# Patient Record
Sex: Female | Born: 2000 | Race: Black or African American | Hispanic: No | Marital: Single | Smoking: Never smoker
Health system: Southern US, Community
[De-identification: ages and names within clinical notes are randomized; demographics above are authoritative.]

## PROBLEM LIST (undated history)

## (undated) ENCOUNTER — Inpatient Hospital Stay (HOSPITAL_COMMUNITY): Payer: Self-pay

## (undated) ENCOUNTER — Ambulatory Visit (HOSPITAL_COMMUNITY): Payer: Medicaid Other

## (undated) DIAGNOSIS — J309 Allergic rhinitis, unspecified: Secondary | ICD-10-CM

## (undated) DIAGNOSIS — O0990 Supervision of high risk pregnancy, unspecified, unspecified trimester: Secondary | ICD-10-CM

## (undated) DIAGNOSIS — J45909 Unspecified asthma, uncomplicated: Secondary | ICD-10-CM

## (undated) DIAGNOSIS — Z3400 Encounter for supervision of normal first pregnancy, unspecified trimester: Secondary | ICD-10-CM

## (undated) DIAGNOSIS — A749 Chlamydial infection, unspecified: Secondary | ICD-10-CM

## (undated) DIAGNOSIS — O1413 Severe pre-eclampsia, third trimester: Secondary | ICD-10-CM

## (undated) DIAGNOSIS — E669 Obesity, unspecified: Secondary | ICD-10-CM

## (undated) DIAGNOSIS — O365990 Maternal care for other known or suspected poor fetal growth, unspecified trimester, not applicable or unspecified: Secondary | ICD-10-CM

## (undated) DIAGNOSIS — L209 Atopic dermatitis, unspecified: Secondary | ICD-10-CM

## (undated) DIAGNOSIS — O43129 Velamentous insertion of umbilical cord, unspecified trimester: Secondary | ICD-10-CM

## (undated) HISTORY — DX: Atopic dermatitis, unspecified: L20.9

## (undated) HISTORY — DX: Obesity, unspecified: E66.9

## (undated) HISTORY — PX: TONSILLECTOMY: SUR1361

## (undated) HISTORY — DX: Allergic rhinitis, unspecified: J30.9

---
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
## 2019-04-24 ENCOUNTER — Other Ambulatory Visit: Payer: Self-pay | Admitting: Pediatrics

## 2019-04-24 DIAGNOSIS — J4541 Moderate persistent asthma with (acute) exacerbation: Secondary | ICD-10-CM

## 2019-04-25 ENCOUNTER — Other Ambulatory Visit: Payer: Self-pay

## 2019-04-25 ENCOUNTER — Encounter: Payer: Self-pay | Admitting: Pediatrics

## 2019-04-25 ENCOUNTER — Ambulatory Visit (INDEPENDENT_AMBULATORY_CARE_PROVIDER_SITE_OTHER): Payer: Medicaid Other | Admitting: Pediatrics

## 2019-04-25 DIAGNOSIS — J4541 Moderate persistent asthma with (acute) exacerbation: Secondary | ICD-10-CM

## 2019-04-25 DIAGNOSIS — J4550 Severe persistent asthma, uncomplicated: Secondary | ICD-10-CM

## 2019-04-25 DIAGNOSIS — J309 Allergic rhinitis, unspecified: Secondary | ICD-10-CM

## 2019-04-25 MED ORDER — MONTELUKAST SODIUM 10 MG PO TABS: 10.0000 mg | ORAL_TABLET | Freq: Every day | ORAL | 5 refills | Status: DC

## 2019-04-25 MED ORDER — FLOVENT HFA 110 MCG/ACT IN AERO: 2.0000 | INHALATION_SPRAY | Freq: Two times a day (BID) | RESPIRATORY_TRACT | 11 refills | Status: DC

## 2019-04-25 MED ORDER — PROAIR HFA 108 (90 BASE) MCG/ACT IN AERS: INHALATION_SPRAY | RESPIRATORY_TRACT | 0 refills | Status: DC

## 2019-04-25 MED ORDER — CETIRIZINE HCL 10 MG PO TABS: 10.0000 mg | ORAL_TABLET | Freq: Every day | ORAL | 5 refills | Status: DC

## 2019-04-25 NOTE — Telephone Encounter (Signed)
Refill request received for albuterol  Last seen 06/2018, is now 74 years ol Last seen for this problem, asthma,: 06/2018  If patient would like a refill, the family will need a visit before a refill will be approved.   Since she is 46, changing to a provider for adult patients is appropriate  Virtual visit is  appropriate.   Please call family to find out if they requested more medicine or if the request was an automatic request from Pharmacy.  Refill not approved.

## 2019-04-25 NOTE — Progress Notes (Signed)
Virtual Visit via Video Note  I connected with Amanda Daniel on 04/25/19 at  4:20 PM EDT by a video enabled telemedicine application and verified that I am speaking with the correct person using two identifiers.   Location of patient/parent: home   I discussed the limitations of evaluation and management by telemedicine and the availability of in person appointments.  I discussed that the purpose of this telehealth visit is to provide medical care while limiting exposure to the novel coronavirus.  The patient expressed understanding and agreed to proceed.  Reason for visit:   Refill for albuterol requested  History of Present Illness:   Seen on 06/2018 for ashtna with exacerbation At that visit Occasional flonase--once a month and cetirizine once a month No singulair Used proair every day,  Had current exacerbation with wheezing at that visit Enalapril after pregnancy Plan : restart flovent, and singulair,  Plan use cetirizine and flonase regularly  Requested FU in one month   Now  Asthma not acting up now, just ran out albutero It has been a while since she had felt she had a strong exacerbation. But she coughs at night, most nights, twice a night And uses pro-air at least once a day She coughs a lot (like crazy) when she walks upstairs She is not using any controller medicine for her asthma or her allergies. Specifically, she is not using Flovent, Flonase, or Singulair  She has been out of all her medicines for a while When she took all of her medicines as prescribed, it helped a lot.  She did not have a heavy breathing like she usually does  When she took the Flovent, she took it twice a day and it helped a lot. She has a lot of the Flonase on hand, she needs a refill on cetirizine  She is no longer using enalapril and her blood pressure is back to normal Observations/Objective:   Alert active Speaks full sentences without coughing or trouble breathing No  distress  Assessment and Plan:   Severe persistent asthma Patient not aware if she is currently wheezing  Plan refills Flovent to take twice a day Pro-air as needed Also refills of cetirizine and Singulair She plans to use Flonase but does not need refills  Plan follow-up video phone call September 8 at 330 to check on frequency of albuterol use, cough at night, cough with climbing stairs.  Follow Up Instructions:   I discussed the assessment and treatment plan with the patient and/or parent/guardian. They were provided an opportunity to ask questions and all were answered. They agreed with the plan and demonstrated an understanding of the instructions.   They were advised to call back or seek an in-person evaluation in the emergency room if the symptoms worsen or if the condition fails to improve as anticipated.  I spent 25 minutes on this telehealth visit inclusive of face-to-face video and care coordination time I was located at clinic during this encounter.  Roselind Messier, MD

## 2019-05-05 IMAGING — US US MFM FETAL NUCHAL TRANSLUCENCY
1 series · 15 of 28 positions shown · non-contrast
Comparison: none

[Series 1: us mfm fetal nuchal translucency · 15 of 35 slices shown]
[im 1/35]
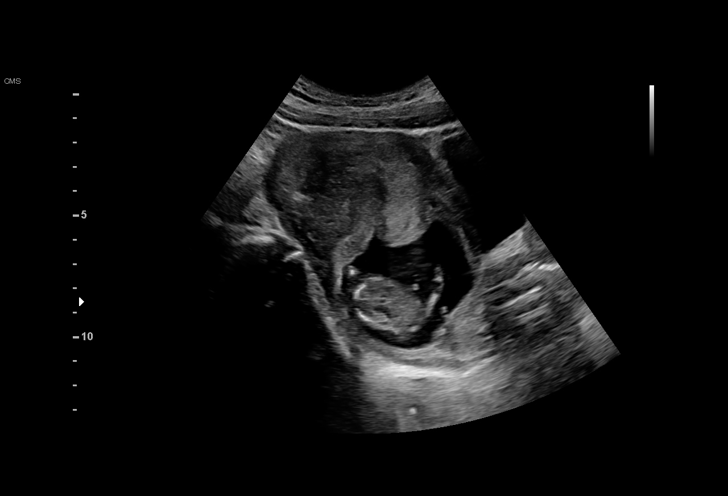
[im 3/35]
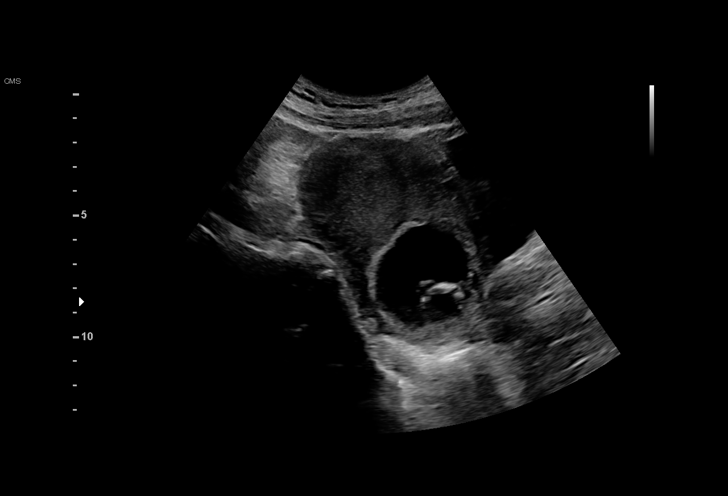
[im 6/35]
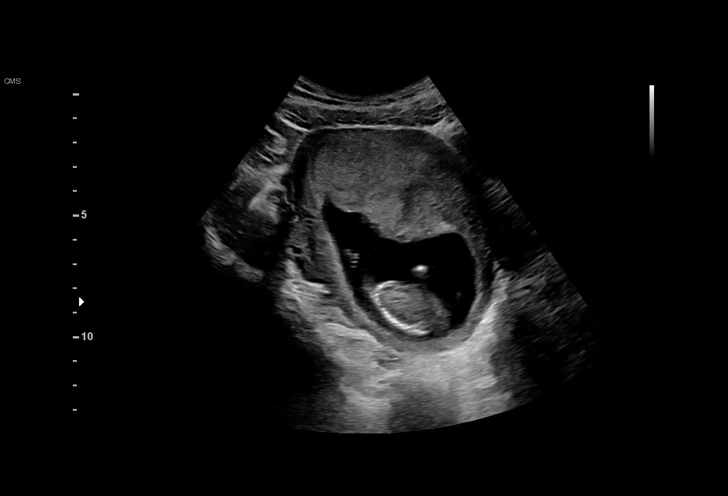
[im 8/35]
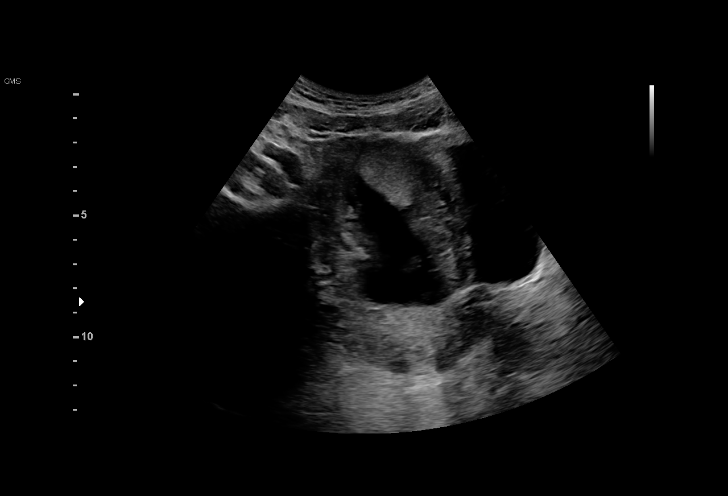
[im 11/35]
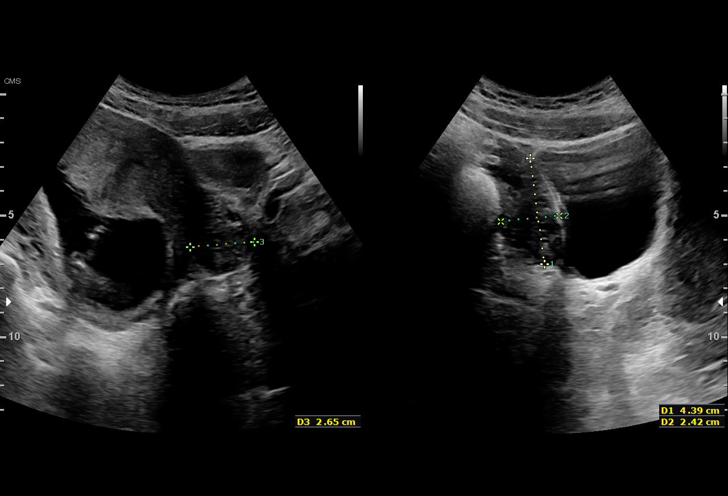
[im 13/35]
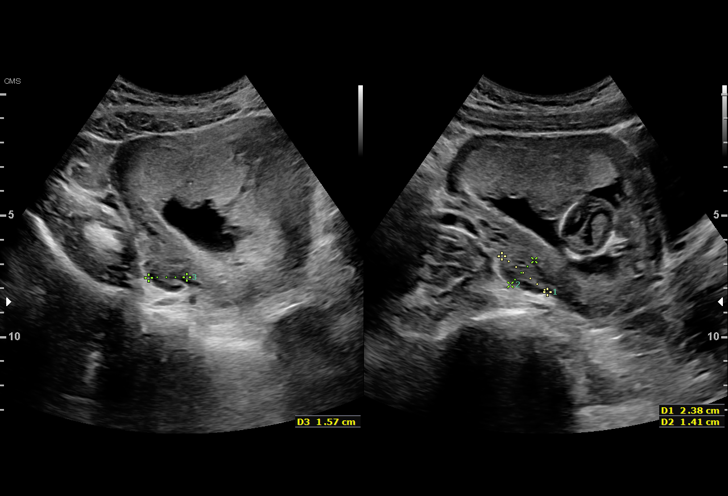
[im 16/35]
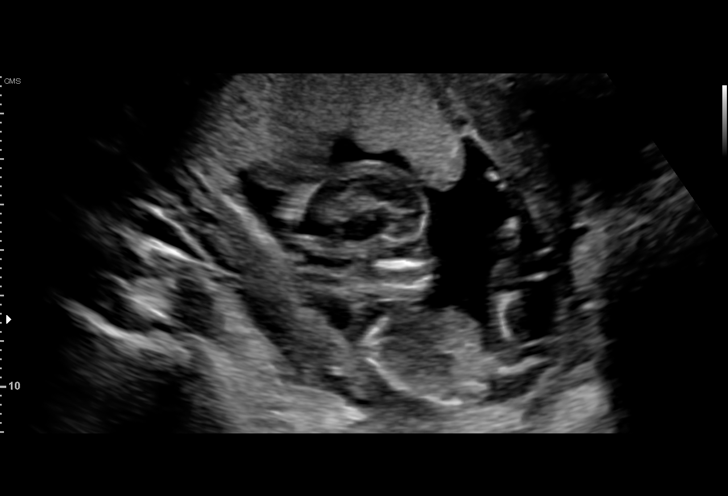
[im 18/35]
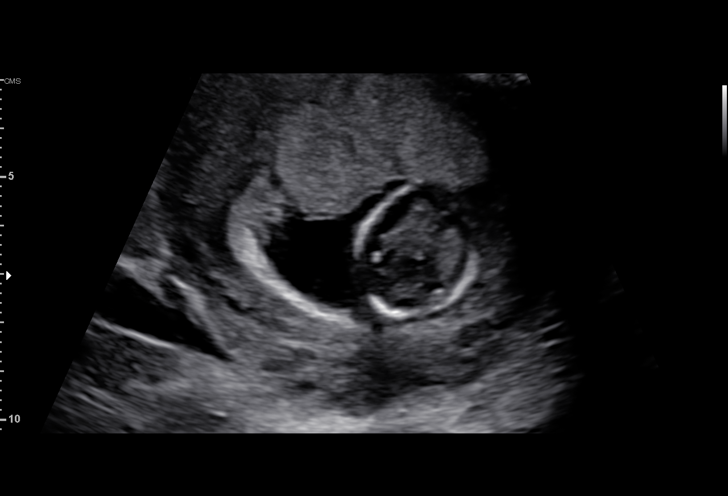
[im 19/35]
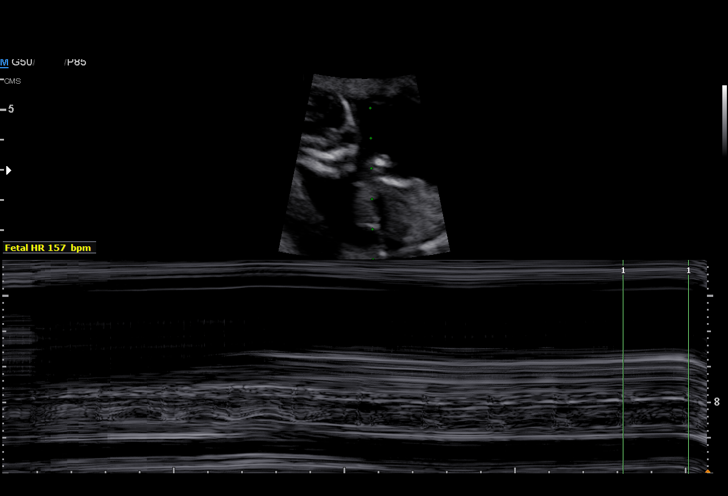
[im 22/35]
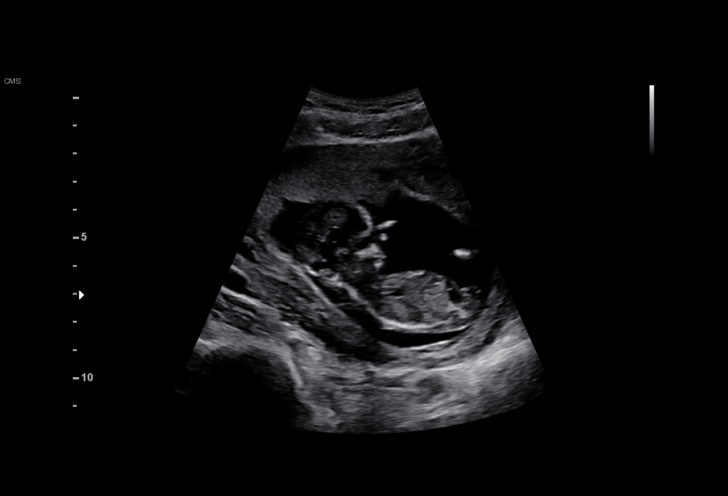
[im 24/35]
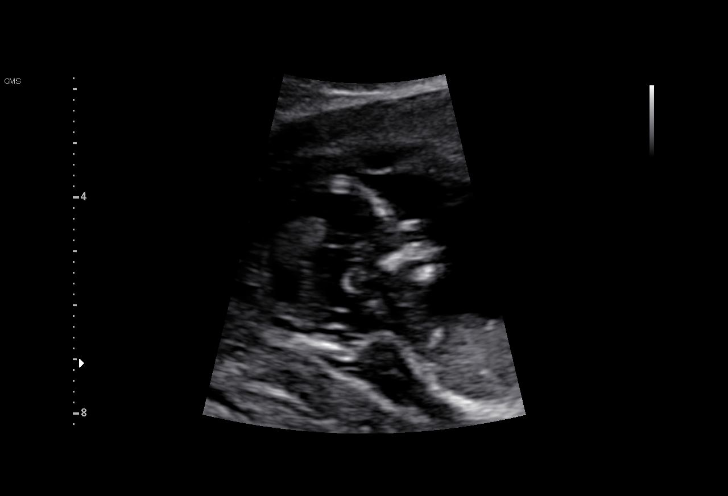
[im 27/35]
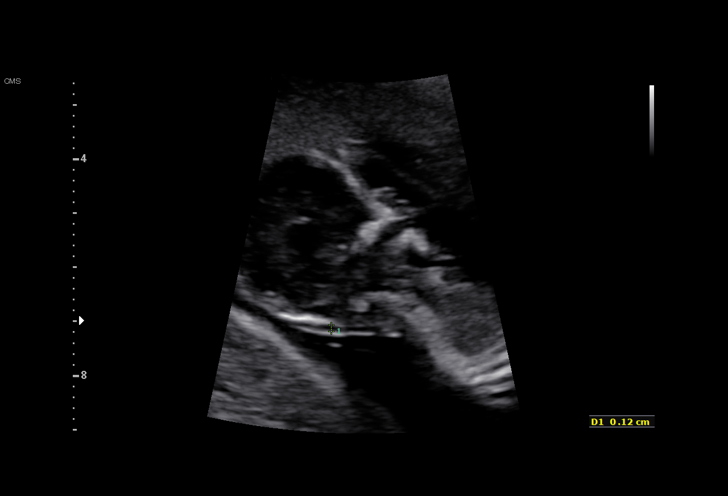
[im 29/35]
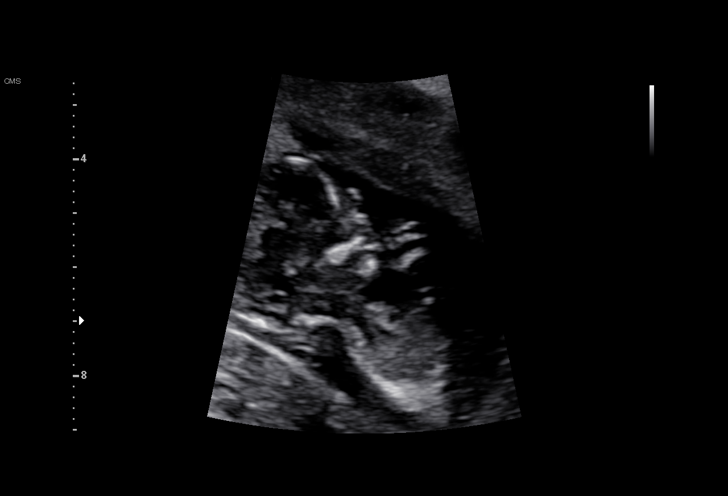
[im 32/35]
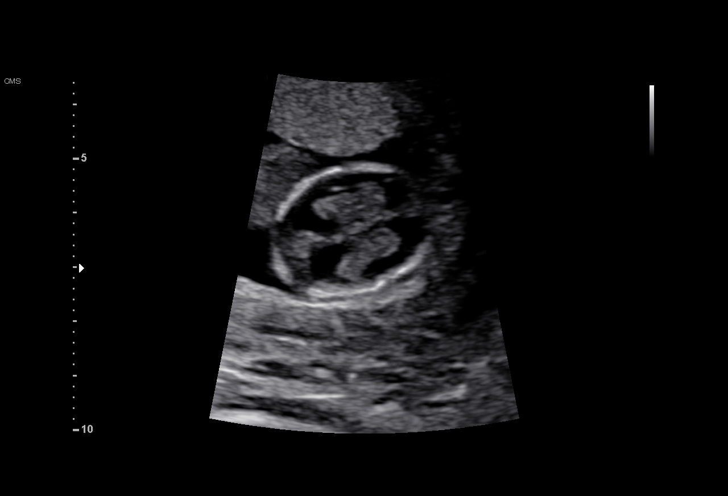
[im 35/35]
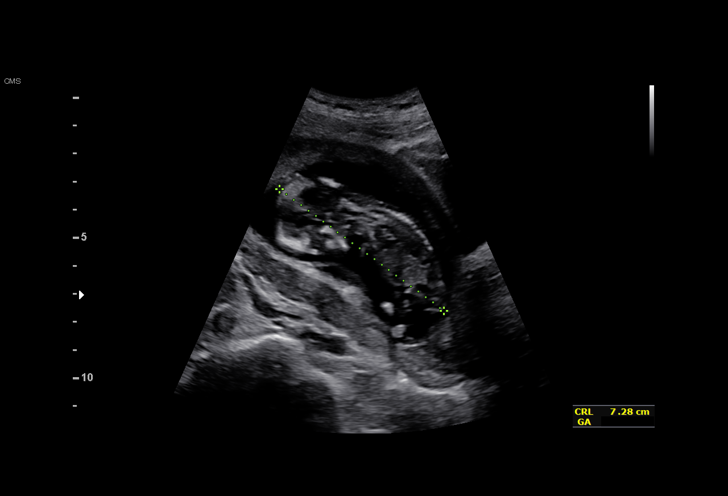

[15 of 28 positions shown; findings below may reference images not displayed]

OB/Gyn Clinic

TRANSLUCENCY

1  OGNJEN OGI BESKER         [id number]      [id number]     [id number]
Indications

13 weeks gestation of pregnancy
Encounter for nuchal translucency
OB History

Gravidity:    1         Term:   0        Prem:   0        SAB:   0
TOP:          0       Ectopic:  0        Living: 0
Fetal Evaluation

Num Of Fetuses:     1
Preg. Location:     Intrauterine
Gest. Sac:          Intrauterine
Fetal Pole:         Visualized
Fetal Heart         157
Rate(bpm):
Cardiac Activity:   Observed
Biometry

CRL:      72.8  mm     G. Age:  13w 1d                  EDD:   12/06/17
Gestational Age

Best:          13w 6d    Det. By:   Early Ultrasound         EDD:   12/01/17
(04/05/17)
1st Trimester Genetic Sonogram Screening

CRL:            72.8  mm    G. Age:   13w 1d                 EDD:   12/06/17
Nuc Trans:       1.2  mm
Nasal Bone:                 Present
Cervix Uterus Adnexa

Cervix
Closed

Uterus
No abnormality visualized.

Left Ovary
Within normal limits.

Right Ovary
Within normal limits.

Cul De Sac:   No free fluid seen.

Adnexa:       No abnormality visualized.
Impression

Single living intrauterine pregnancy at 97w7d.
NT 1.2mm.
Nasal bone present.
First trimester screen labs drawn.
Recommendations

Follow-up ultrasound at 18 weeks for complete anatomy
survey

## 2019-05-15 ENCOUNTER — Other Ambulatory Visit: Payer: Self-pay | Admitting: Pediatrics

## 2019-05-15 DIAGNOSIS — J4541 Moderate persistent asthma with (acute) exacerbation: Secondary | ICD-10-CM

## 2019-05-30 ENCOUNTER — Ambulatory Visit (INDEPENDENT_AMBULATORY_CARE_PROVIDER_SITE_OTHER): Payer: Medicaid Other | Admitting: Pediatrics

## 2019-05-30 ENCOUNTER — Encounter: Payer: Self-pay | Admitting: Pediatrics

## 2019-05-30 DIAGNOSIS — J4540 Moderate persistent asthma, uncomplicated: Secondary | ICD-10-CM | POA: Diagnosis not present

## 2019-05-30 NOTE — Progress Notes (Signed)
Virtual Visit via Video Note  I connected with Amanda Daniel 's patient  on 05/30/19 at  3:30 PM EDT by a video enabled telemedicine application and verified that I am speaking with the correct person using two identifiers.   Location of patient/parent: on street   I discussed the limitations of evaluation and management by telemedicine and the availability of in person appointments.  I discussed that the purpose of this telehealth visit is to provide medical care while limiting exposure to the novel coronavirus.  The patient expressed understanding and agreed to proceed.  Reason for visit:   Follow up asthma  History of Present Illness:   8/4: asthma visit Prompted by Albuterol request Coughing most nights, Pro-air twice a day , most days  Now? Now going good New one is helping-Flovent  twice a day--two puff  And a spacer  No cough at night needs albuterol--twice a week  Walking better Has more energy  Allergy meds: Not using unless need it and not really needed  Is using singulair  No refills needed   Observations/Objective:   No cough no apparent distress Completing full sentences easily  Assessment and Plan:   Much improved asthma control With regular use of controller Flovent  No refills needed  Follow Up Instructions:   Follow-up video call in 2 months  No   changes in medicine prescriptions I discussed the assessment and treatment plan with the patient and/or parent/guardian. They were provided an opportunity to ask questions and all were answered. They agreed with the plan and demonstrated an understanding of the instructions.   They were advised to call back or seek an in-person evaluation in the emergency room if the symptoms worsen or if the condition fails to improve as anticipated.  I spent 15 minutes on this telehealth visit inclusive of face-to-face video and care coordination time I was located at clinic during this encounter.  Roselind Messier, MD

## 2019-08-01 ENCOUNTER — Encounter: Payer: Self-pay | Admitting: Pediatrics

## 2019-08-01 ENCOUNTER — Ambulatory Visit (INDEPENDENT_AMBULATORY_CARE_PROVIDER_SITE_OTHER): Payer: Medicaid Other | Admitting: Pediatrics

## 2019-08-01 DIAGNOSIS — J309 Allergic rhinitis, unspecified: Secondary | ICD-10-CM | POA: Diagnosis not present

## 2019-08-01 DIAGNOSIS — J4541 Moderate persistent asthma with (acute) exacerbation: Secondary | ICD-10-CM | POA: Diagnosis not present

## 2019-08-01 MED ORDER — TRIAMCINOLONE ACETONIDE 0.1 % EX OINT: 1.0000 "application " | TOPICAL_OINTMENT | Freq: Two times a day (BID) | CUTANEOUS | 1 refills | Status: DC

## 2019-08-01 MED ORDER — FLUTICASONE PROPIONATE 50 MCG/ACT NA SUSP: 1.0000 | Freq: Every day | NASAL | 5 refills | Status: AC

## 2019-08-01 MED ORDER — FLOVENT HFA 110 MCG/ACT IN AERO: 2.0000 | INHALATION_SPRAY | Freq: Two times a day (BID) | RESPIRATORY_TRACT | 11 refills | Status: AC

## 2019-08-01 MED ORDER — MONTELUKAST SODIUM 10 MG PO TABS: 10.0000 mg | ORAL_TABLET | Freq: Every day | ORAL | 5 refills | Status: DC

## 2019-08-01 MED ORDER — CETIRIZINE HCL 10 MG PO TABS: 10.0000 mg | ORAL_TABLET | Freq: Every day | ORAL | 5 refills | Status: DC

## 2019-08-01 NOTE — Progress Notes (Signed)
Virtual Visit via Video Note  I connected with Rikita Tretter 's patient  on 08/01/19 at  4:10 PM EST by a video enabled telemedicine application and verified that I am speaking with the correct person using two identifiers.   Location of patient/parent: home   I discussed the limitations of evaluation and management by telemedicine and the availability of in person appointments.  I discussed that the purpose of this telehealth visit is to provide medical care while limiting exposure to the novel coronavirus.  The mother expressed understanding and agreed to proceed.  Reason for visit:  Chief Complaint  Patient presents with  . Follow-up    asthma  . Rash    neck x week   . Medication Refill    all meds    History of Present Illness:  August 4 video visit seen for asthma, coughing most nights and using albuterol twice a day most days.  Restart Singulair and Flovent and spacer  Follow-up visit September 8 using Flovent Using albuterol twice a week Not really using allergy medicines  Asthma: " It is great" Not bothering her at all  Using Flonase most days Cetirizine and singulair most days --initially patient said she took a pill every day.  It is not clear to me whether she takes cetirizine or Singulair more regularly or if she takes both every day.  She request refills for both  Flovent 2 puff everyday  Walk ok and running after her 24-year-old No cough when walk   Need refill all meds  Proair once a week--less than last video visit 2 months ago  New problem  bumps on neck for about one  Wondering if it is eczema They are itchy No where else Putting anything on it--tried diaper rash cream,    Observations/Objective:   Full sentences No cough  Neck with discrete 3 mm pink papules bilateral  Assessment and Plan:   Moderate to severe persistent asthma with recent episode of very poor control with now improving control. Plan refill Singulair, cetirizine, Flovent,  and Flonase No refill Proventil needed, but acceptable  New rash and patient with a history of atopic dermatitis More likely contact for unusual distribution of neck only Trial triamcinolone 0.1% twice daily for no more than a week  Follow Up Instructions:   Video visit 2 months Or as needed   I discussed the assessment and treatment plan with the patient and/or parent/guardian. They were provided an opportunity to ask questions and all were answered. They agreed with the plan and demonstrated an understanding of the instructions.   They were advised to call back or seek an in-person evaluation in the emergency room if the symptoms worsen or if the condition fails to improve as anticipated.  I spent 15 minutes on this telehealth visit inclusive of face-to-face video and care coordination time I was located at clinic during this encounter.  Roselind Messier, MD

## 2019-09-07 IMAGING — US US MFM UA CORD DOPPLER
1 series · 15 of 28 positions shown · non-contrast
Comparison: none

[Series 1: us mfm ua cord doppler · 31 acquisitions, 15 frames shown]
[im 1/31]
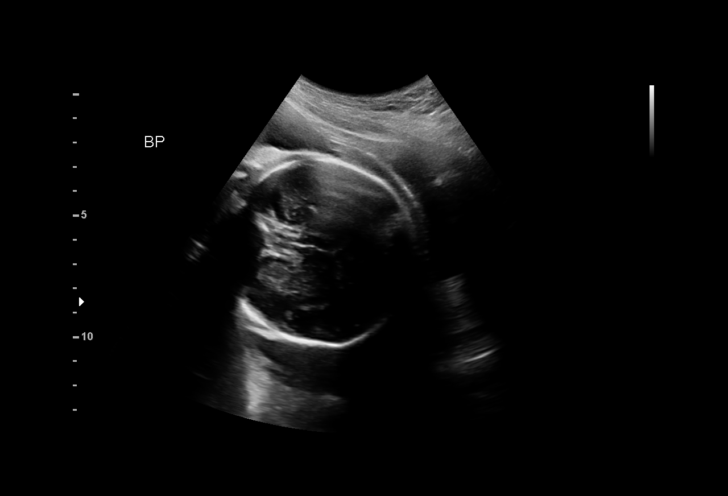
[im 3/31]
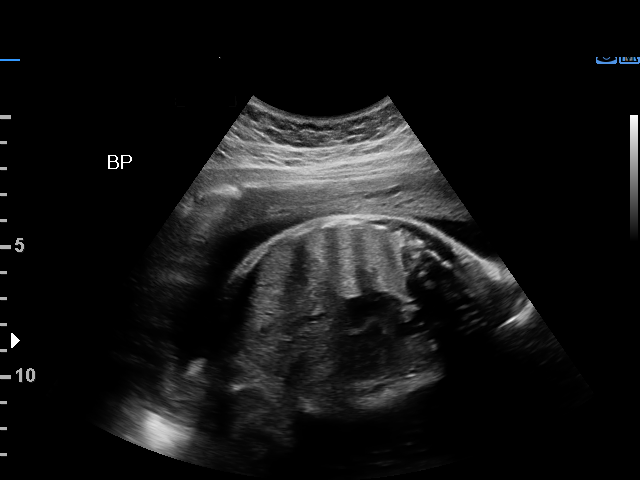
[im 5/31]
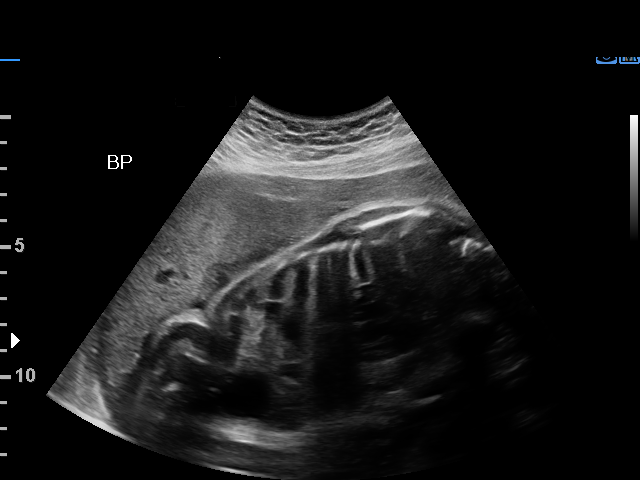
[im 7/31]
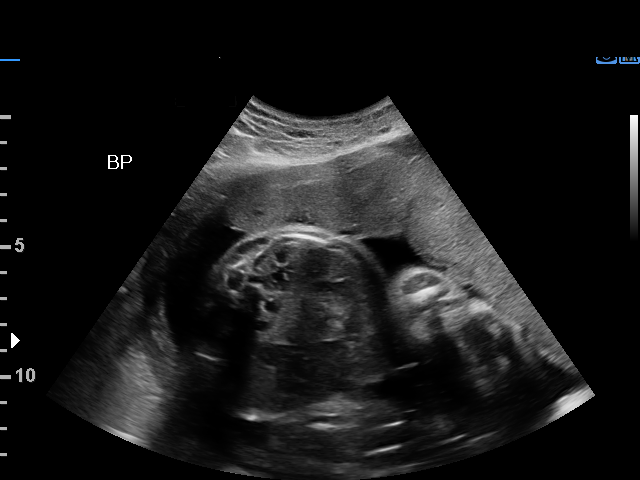
[im 9/31]
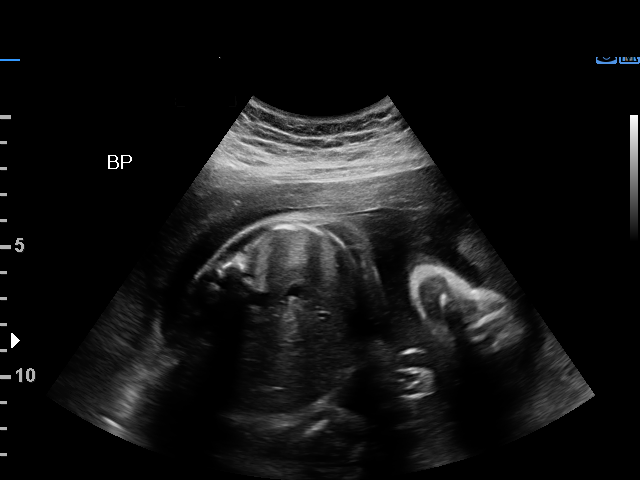
[im 12/31]
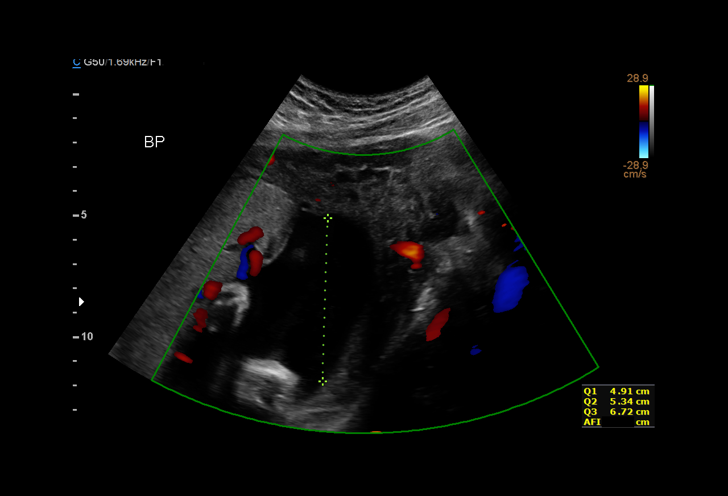
[im 14/31]
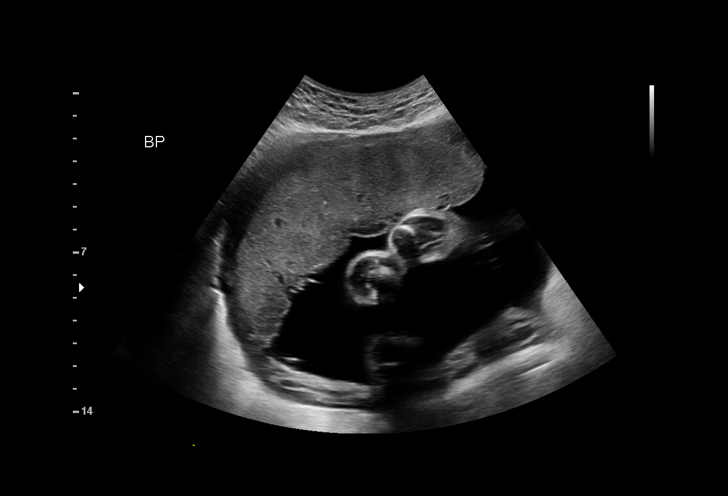
[im 16/31]
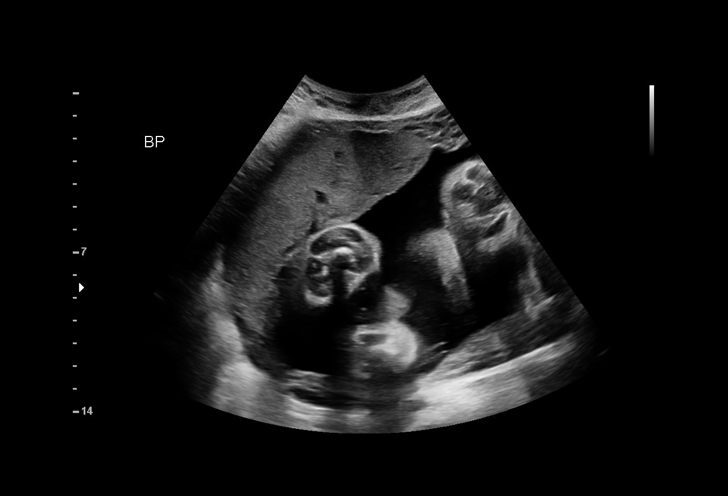
[im 17/31]
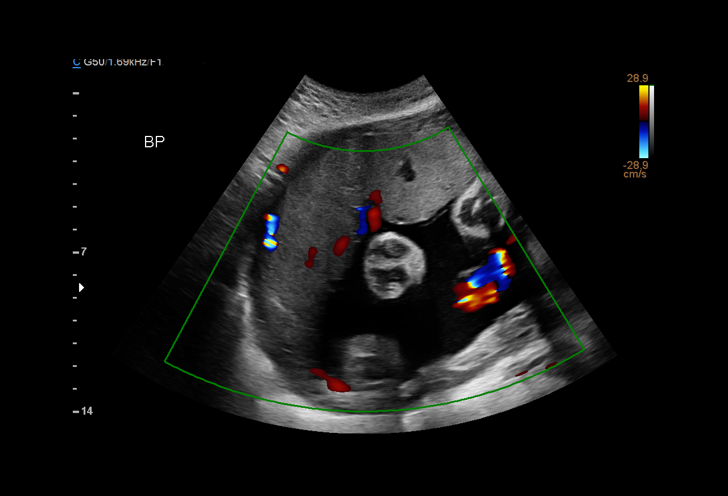
[im 19/31]
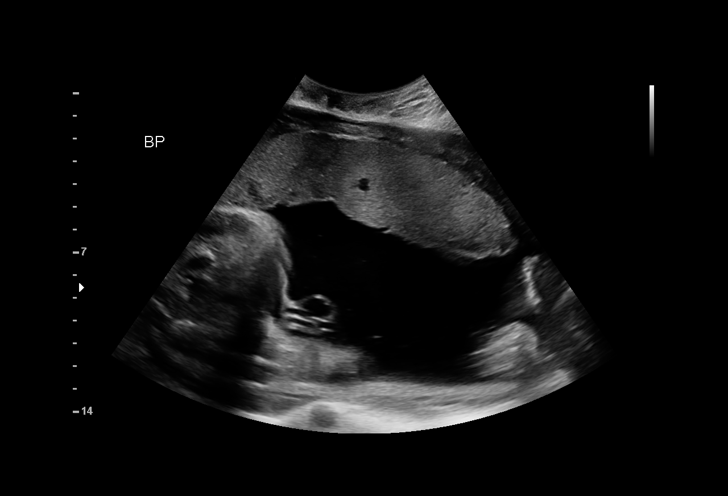
[im 22/31]
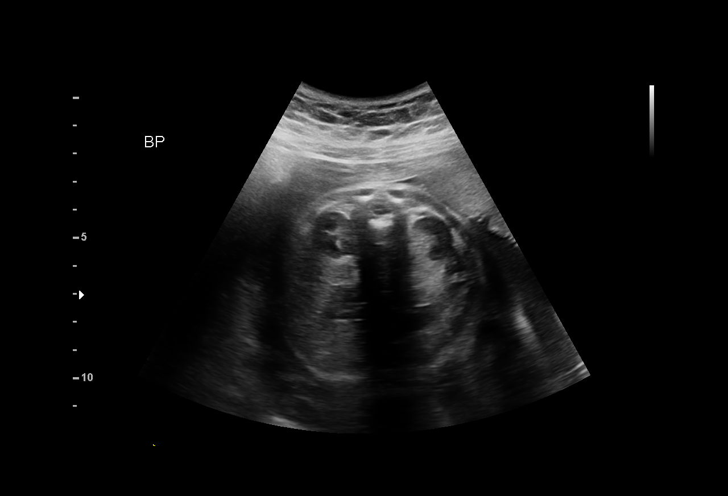
[im 24/31]
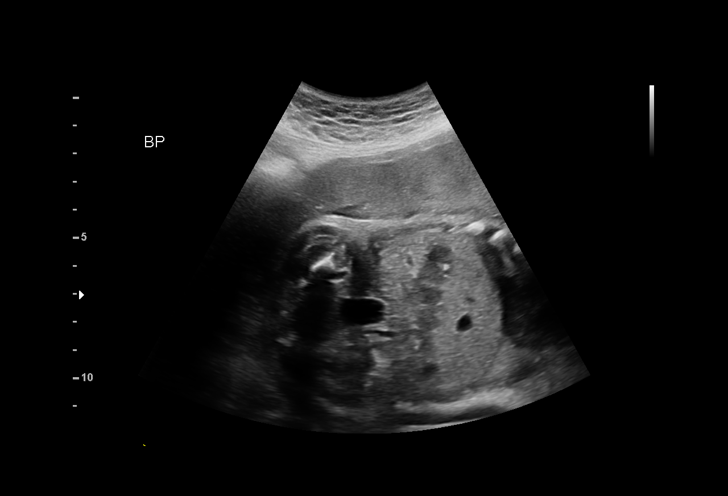
[im 26/31]
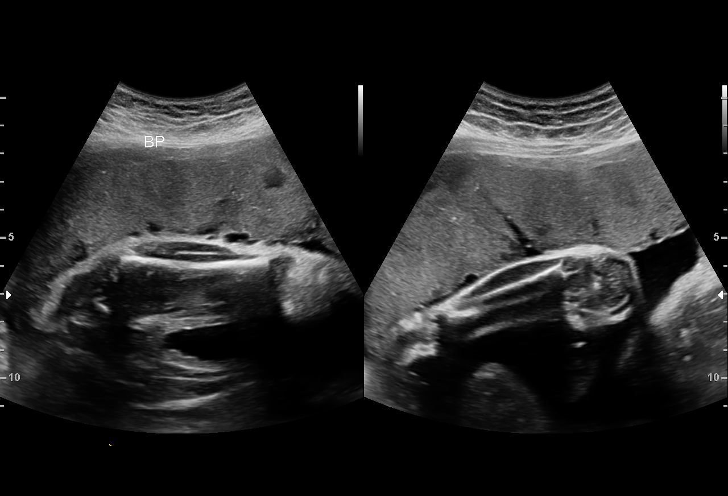
[im 28/31]
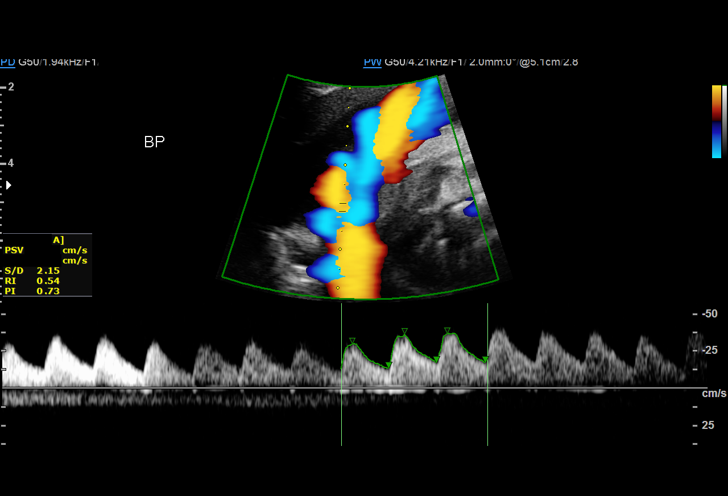
[im 31/31]
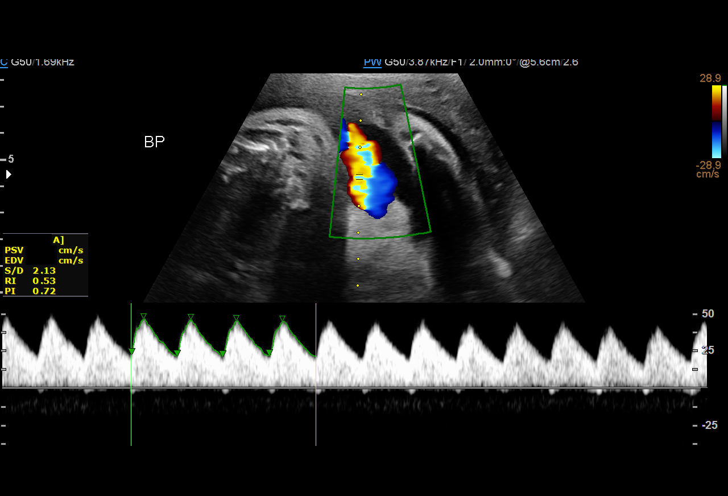

[15 of 28 positions shown; findings below may reference images not displayed]

OB/Gyn Clinic

1  ARMI FOERSTER            [id number]      [phone]     [id number]
2  ARMI FOERSTER            [id number]      [phone]     [id number]
Indications

31 weeks gestation of pregnancy
Velamentous insertion of umbilical cord
Maternal care for known or suspected poor
fetal growth, third trimester, not applicable or
unspecified
OB History

Gravidity:    1         Term:   0        Prem:   0        SAB:   0
TOP:          0       Ectopic:  0        Living: 0
Fetal Evaluation

Num Of Fetuses:     1
Fetal Heart         168
Rate(bpm):
Cardiac Activity:   Observed
Presentation:       Cephalic
Placenta:           Anterior, above cervical os
P. Cord Insertion:  Velamentous insertion

Amniotic Fluid
AFI FV:      Subjectively within normal limits

AFI Sum(cm)     %Tile       Largest Pocket(cm)
21.87           85
RUQ(cm)       RLQ(cm)       LUQ(cm)        LLQ(cm)
4.91
Biophysical Evaluation

Amniotic F.V:   Within normal limits       F. Tone:        Observed
F. Movement:    Observed                   Score:          [DATE]
F. Breathing:   Observed
Gestational Age

Best:          31w 5d     Det. By:  Early Ultrasound         EDD:   12/01/17
(04/05/17)
Doppler - Fetal Vessels

Umbilical Artery
S/D     %tile     RI              PI              PSV    ADFV    RDFV
(cm/s)
2.13       17   0.53             0.72              45.9       N       N

Impression

IUP at 31+5 weeks with IUGR
Normal amniotic fluid
BPP [DATE]
Umbilical artery dopplers normal
Recommendations

Repeat scan for growth and BPP/dopplers in 1 weeks.
Continue weekly dopplers and BPP

## 2019-09-15 IMAGING — US US MFM UA CORD DOPPLER
1 series · 13 of 28 positions shown · non-contrast
Comparison: none

[Series 1: us mfm ua cord doppler · 53 acquisitions, 13 frames shown]
[im 2/53]
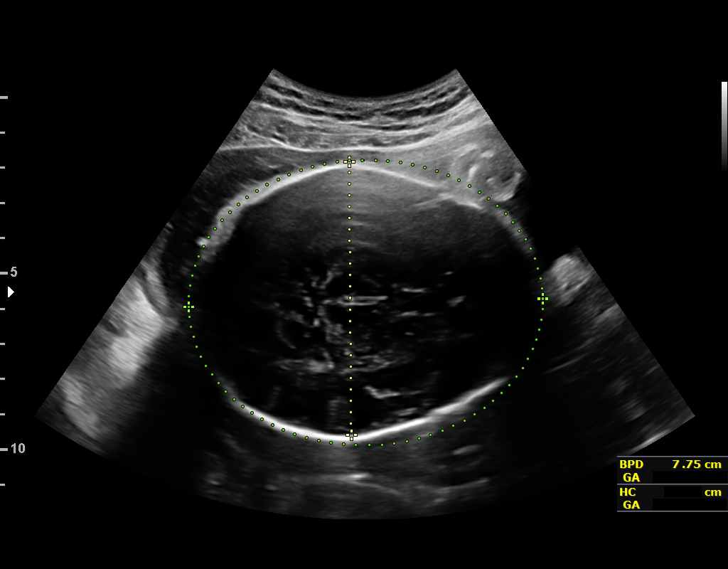
[im 6/53]
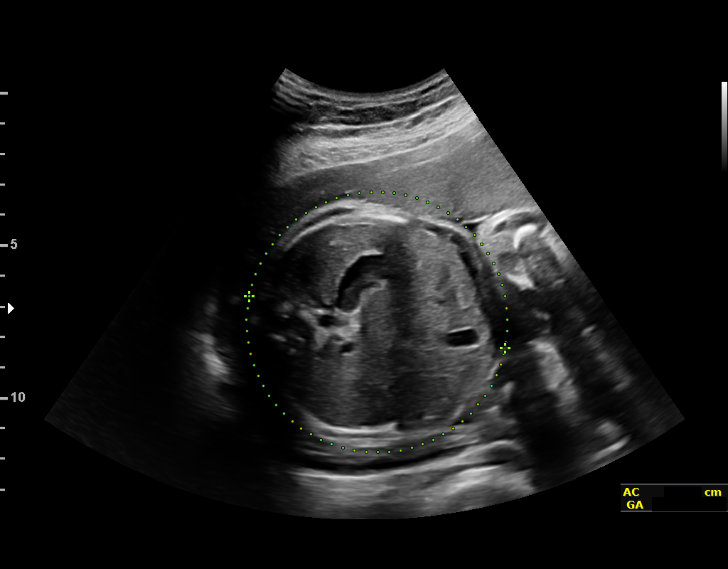
[im 10/53]
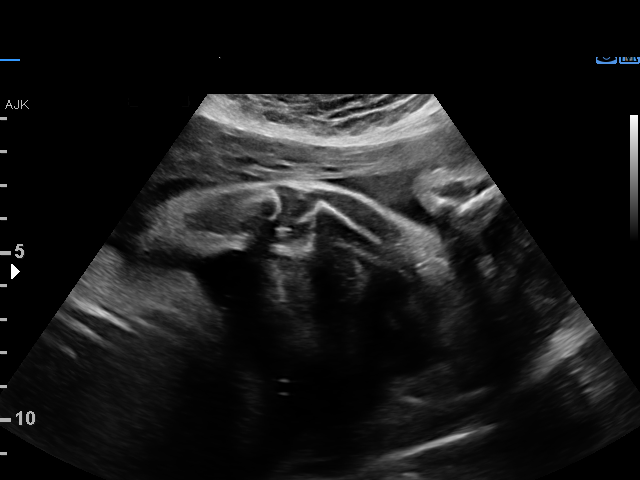
[im 14/53]
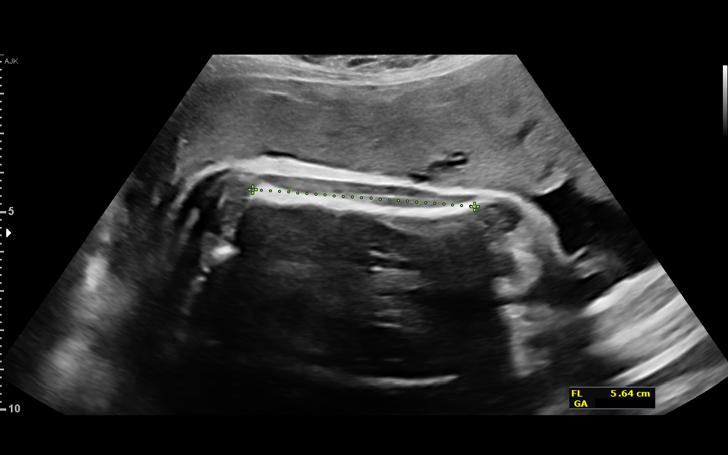
[im 18/53]
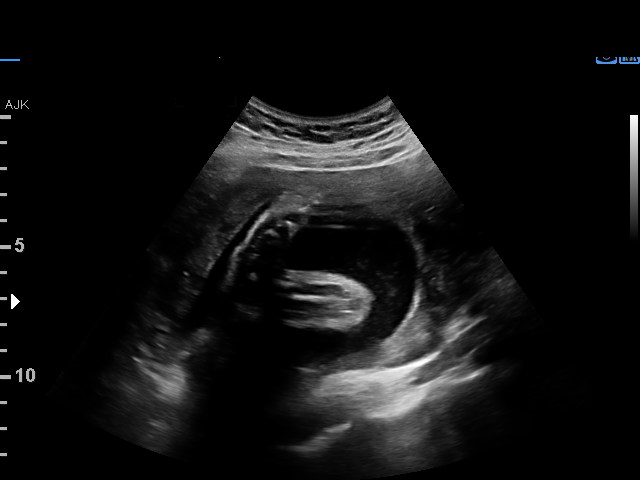
[im 22/53]
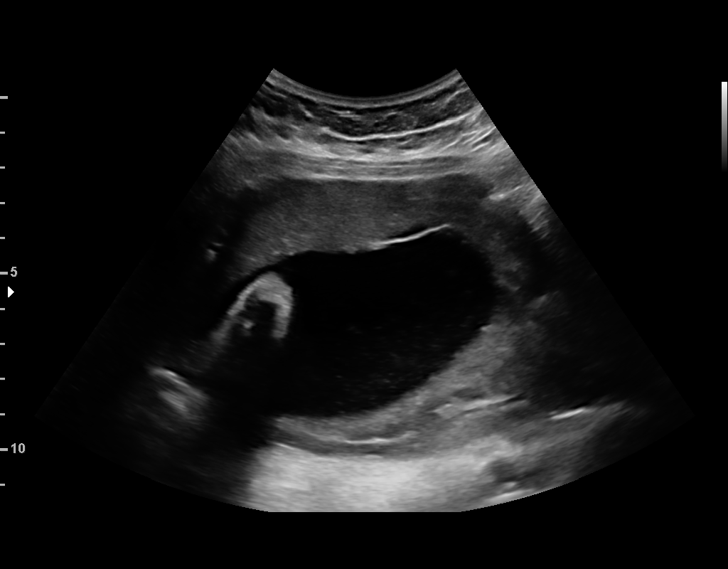
[im 27/53]
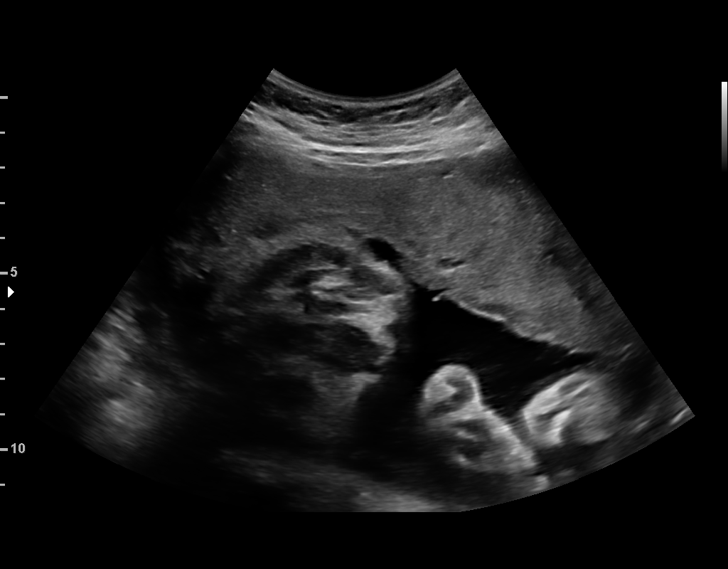
[im 31/53]
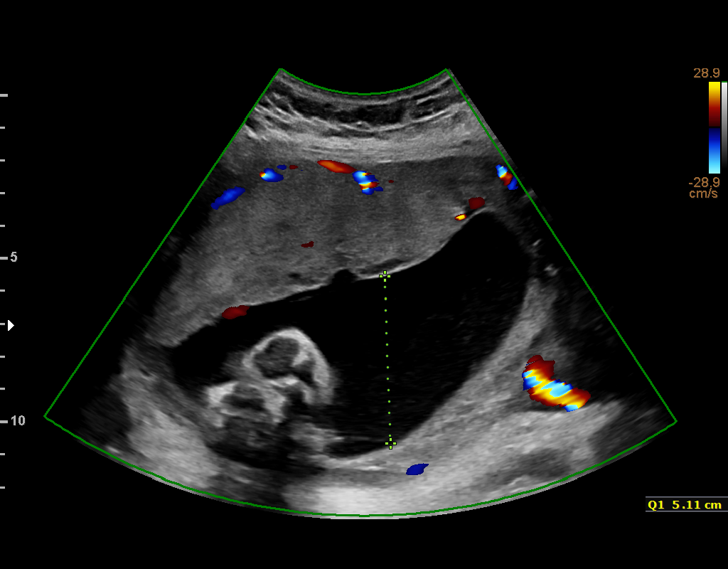
[im 35/53]
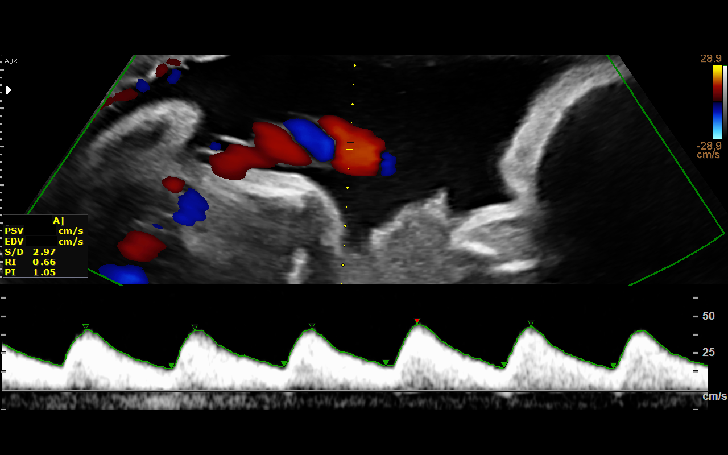
[im 39/53]
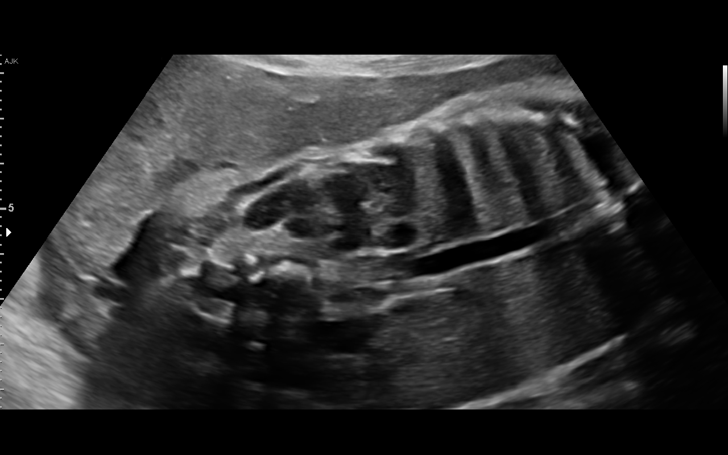
[im 43/53]
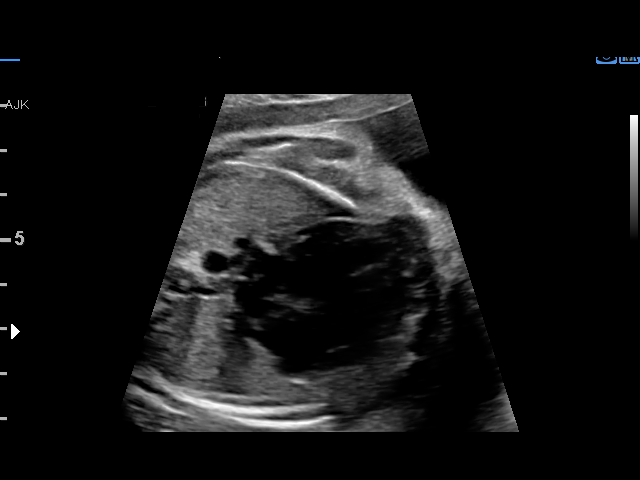
[im 47/53]
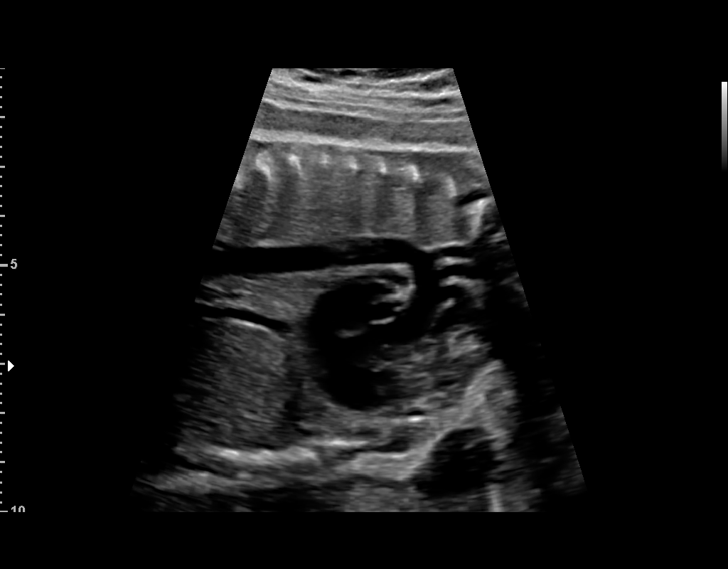
[im 51/53]
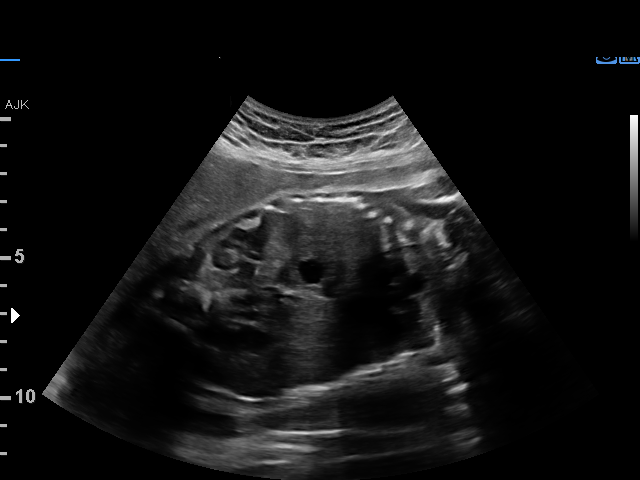

[13 of 28 positions shown; findings below may reference images not displayed]

OB/Gyn Clinic

1  HANS-THEO FROEHLICH            [id number]      [phone]     [id number]
2  HANS-THEO FROEHLICH            [id number]      [id number]     [id number]
3  HANS-THEO FROEHLICH            [id number]      [id number]     [id number]
Indications

32 weeks gestation of pregnancy
Velamentous insertion of umbilical cord
Maternal care for known or suspected poor
fetal growth, third trimester, not applicable or
unspecified
Obesity complicating pregnancy, third
trimester (pre-preg bmi:30.3)
Teen pregnancy
OB History

Gravidity:    1         Term:   0        Prem:   0        SAB:   0
TOP:          0       Ectopic:  0        Living: 0
Fetal Evaluation

Num Of Fetuses:     1
Fetal Heart         159
Rate(bpm):
Cardiac Activity:   Observed
Presentation:       Cephalic
Placenta:           Anterior, above cervical os
P. Cord Insertion:  Velamentous insertion
Amniotic Fluid
AFI FV:      Subjectively within normal limits

AFI Sum(cm)     %Tile       Largest Pocket(cm)
17.95           66

RUQ(cm)       RLQ(cm)       LUQ(cm)        LLQ(cm)
5.11
Biophysical Evaluation

Amniotic F.V:   Within normal limits       F. Tone:        Observed
F. Movement:    Observed                   Score:          [DATE]
F. Breathing:   Observed
Biometry

BPD:      76.9  mm     G. Age:  30w 6d          4  %    CI:        76.04   %    70 - 86
FL/HC:      20.2   %    19.9 -
HC:      279.5  mm     G. Age:  30w 4d        < 3  %    HC/AC:      1.03        0.96 -
AC:      271.2  mm     G. Age:  31w 2d         10  %    FL/BPD:     73.3   %    71 - 87
FL:       56.4  mm     G. Age:  29w 5d        < 3  %    FL/AC:      20.8   %    20 - 24

Est. FW:    9696  gm      3 lb 9 oz     19  %
Gestational Age

U/S Today:     30w 4d                                        EDD:   12/17/17
Best:          32w 6d     Det. By:  Early Ultrasound         EDD:   12/01/17
(04/05/17)
Anatomy

Cranium:               Appears normal         Aortic Arch:            Appears normal
Cavum:                 Appears normal         Ductal Arch:            Previously seen
Ventricles:            Previously seen        Diaphragm:              Appears normal
Choroid Plexus:        Previously seen        Stomach:                Appears normal, left
sided
Cerebellum:            Previously seen        Abdomen:                Appears normal
Posterior Fossa:       Previously seen        Abdominal Wall:         Previously seen
Nuchal Fold:           Previously seen        Cord Vessels:           Previously seen
Face:                  profile not well       Kidneys:                Appear normal
visualized
Lips:                  Appears normal         Bladder:                Appears normal
Thoracic:              Appears normal         Spine:                  Ltd; no intracranial
signs of NT prev
Heart:                 Appears normal         Upper Extremities:      Previously seen
(4CH, axis, and situs
RVOT:                  Appears normal         Lower Extremities:      Previously seen
LVOT:                  Appears normal

Other:  Fetus appears to be a female. Heels and 5th digit prev seen.
Doppler - Fetal Vessels

Umbilical Artery
S/D     %tile     RI              PI              PSV    ADFV    RDFV
(cm/s)
2.89       63   0.65             0.99             39.01      No      No
Impression

Single living intrauterine pregnancy at 32w 6d.
Cephalic presentation.
Placenta Anterior, above cervical os.
Normal amniotic fluid volume.
Composite fetal growth in the 19%ile. AC 10%ile.
Normal interval fetal anatomy.
BPP [DATE].
Normal UA dopplers without absent or reversed flow.
Recommendations

Recommend follow-up ultrasound examination in 3 weeks for
reassessment of fetal growth.

## 2019-09-26 ENCOUNTER — Telehealth: Payer: Medicaid Other | Admitting: Pediatrics

## 2019-09-26 ENCOUNTER — Other Ambulatory Visit: Payer: Self-pay

## 2020-04-30 ENCOUNTER — Ambulatory Visit: Payer: Medicaid Other

## 2020-04-30 ENCOUNTER — Ambulatory Visit: Payer: Medicaid Other | Attending: Internal Medicine

## 2020-04-30 NOTE — Progress Notes (Signed)
   Covid-19 Vaccination Clinic  Name:  Amanda Daniel    MRN: [id number] DOB: 12-Mar-2001  04/30/2020  Ms. Amanda Daniel was observed post Covid-19 immunization for 15 minutes without incident. She was provided with Vaccine Information Sheet and instruction to access the V-Safe system.   Ms. Amanda Daniel was instructed to call 911 with any severe reactions post vaccine: Marland Kitchen Difficulty breathing  . Swelling of face and throat  . A fast heartbeat  . A bad rash all over body  . Dizziness and weakness

## 2020-05-21 ENCOUNTER — Ambulatory Visit: Payer: Medicaid Other | Attending: Internal Medicine

## 2020-05-21 DIAGNOSIS — Z23 Encounter for immunization: Secondary | ICD-10-CM

## 2020-05-21 NOTE — Progress Notes (Signed)
   Covid-19 Vaccination Clinic  Name:  Amanda Daniel    MRN: [id number] DOB: 02/17/2001  05/21/2020  [name] was observed post Covid-19 immunization for 30 minutes based on pre-vaccination screening without incident. She was provided with Vaccine Information Sheet and instruction to access the V-Safe system.   [name] was instructed to call 911 with any severe reactions post vaccine: Marland Kitchen Difficulty breathing  . Swelling of face and throat  . A fast heartbeat  . A bad rash all over body  . Dizziness and weakness   Immunizations Administered    Name Date Dose VIS Date Route   Pfizer COVID-19 Vaccine 05/21/2020  1:58 PM 0.3 mL 11/15/2018 Intramuscular   Manufacturer: [employer], [employer]   Lot: [id number]   NDC: [address]-5

## 2020-12-17 ENCOUNTER — Emergency Department (HOSPITAL_COMMUNITY): Payer: Medicaid Other

## 2020-12-17 ENCOUNTER — Other Ambulatory Visit: Payer: Self-pay

## 2020-12-17 ENCOUNTER — Emergency Department (HOSPITAL_COMMUNITY)
Admission: EM | Admit: 2020-12-17 | Discharge: 2020-12-17 | Disposition: A | Discharge status: Home / Self Care | Payer: Medicaid Other | Attending: Emergency Medicine | Admitting: Emergency Medicine

## 2020-12-17 DIAGNOSIS — S59901A Unspecified injury of right elbow, initial encounter: Secondary | ICD-10-CM | POA: Diagnosis not present

## 2020-12-17 DIAGNOSIS — J45909 Unspecified asthma, uncomplicated: Secondary | ICD-10-CM | POA: Diagnosis not present

## 2020-12-17 DIAGNOSIS — Y9351 Activity, roller skating (inline) and skateboarding: Secondary | ICD-10-CM | POA: Diagnosis not present

## 2020-12-17 DIAGNOSIS — Z91040 Latex allergy status: Secondary | ICD-10-CM | POA: Insufficient documentation

## 2020-12-17 DIAGNOSIS — Z7952 Long term (current) use of systemic steroids: Secondary | ICD-10-CM | POA: Insufficient documentation

## 2020-12-17 DIAGNOSIS — Z91010 Allergy to peanuts: Secondary | ICD-10-CM | POA: Diagnosis not present

## 2020-12-17 MED ORDER — OXYCODONE-ACETAMINOPHEN 5-325 MG PO TABS: 1.0000 | ORAL_TABLET | Freq: Four times a day (QID) | ORAL | 0 refills | Status: DC | PRN

## 2020-12-17 MED ORDER — IBUPROFEN 400 MG PO TABS
600.0000 mg | ORAL_TABLET | Freq: Once | ORAL | Status: AC
  Administered 2020-12-17: 600 mg via ORAL
  Filled 2020-12-17: qty 1

## 2020-12-17 NOTE — Discharge Instructions (Signed)
As discussed, there is some concern for possible fracture in the elbow. Keep the splint clean, dry and in place at all times. Apply a dry ice pack to your elbow to help with swelling and pain. You can take 600mg  ibuprofen every 6 hours as needed for pain. You can take the percocet every 6 hours for more severe pain. Be aware this medication can make your drowsy, do not drive or drink alcohol while taking. Call the orthopedic specialist office the next business day to schedule an appointment in about 1 week for repeat xray and follow up on your injury. Return to the ED for concerning symptoms.

## 2020-12-17 NOTE — ED Notes (Signed)
Ortho tech at bedside 

## 2020-12-17 NOTE — ED Notes (Signed)
Ortho tech paged at this time and made aware

## 2020-12-17 NOTE — ED Provider Notes (Signed)
[facility] EMERGENCY DEPARTMENT Provider Note   CSN: [id number] Arrival date & time: 12/17/20  1654     History Chief Complaint  Patient presents with  . Arm Pain    Amanda Daniel is a 20 y.o. female presenting for evaluation of right elbow pain that began yesterday.  Patient states she was at the skating rink yesterday when she had a mechanical fall.  She fell with her right arm outstretched behind her.  She is having pain to the anterior and lateral aspect of the right elbow.  Pain is worse with movement.  Treated with ibuprofen earlier today.  No numbness or wound.  No head injury or other injuries reported.  No prior injury to right elbow.  The history is provided by the patient.       Past Medical History:  Diagnosis Date  . Allergic rhinitis   . Asthma    hasnt used rescue inhaler in months  . Atopic dermatitis 2014  . Chlamydia infection affecting pregnancy 06/17/2017   Was treated 06/15/17, partner also treated [x ] TOC > check at 20 wk visit> neg  . Obesity 2013  . Preeclampsia, severe, third trimester 11/09/2017  . Pregnancy affected by fetal growth restriction 11/03/2017       Guidelines for Antenatal Testing and Sonography  (with updated ICD-10 codes) INDICATION U/S NST/AFI DELIVERY IUGR- O36.5990   EFW < 10% w/ AEDF & low AFV or EFW < 3%     EFW < 10%, Nml Dopplers & AFV, AC<3%, no other comorbidities  PRN  [id number]  Inpatient  24//BPP+dopplers wkly  PRN  PRN or 39   . Supervision of high risk pregnancy, antepartum 05/20/2017    Clinic [facility] Prenatal Labs Dating 1st trimester u/s Blood type: O/Positive/-- (08/30 0859) O pos Genetic Screen 1 Screen:    AFP:     Quad:     NIPS: Antibody:Negative (08/30 0859)neg Anatomic [place]  normal except velamentous cord Rubella: 3.79 (08/30 0859)Immune GTT Early:               Third trimester:  RPR: Non Reactive (08/30 0859) NR Flu vaccine  06/17/17 HBsAg: Negative (08/30 0859) Neg TDaP   . Supervision of  normal first teen pregnancy 05/20/2017  . 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 Obesity in pregnancy, antepartum 09/29/2017  . Allergic rhinitis 03/27/2014  . Atopic dermatitis 03/27/2014  . BMI (body mass index), pediatric, > 99% for age 75/09/2013  . Asthma, moderate persistent, well-controlled 12/20/2013  . Failed hearing screening 12/20/2013  . Acne 12/20/2013    Past Surgical History:  Procedure Laterality Date  . TONSILLECTOMY       OB History    Gravida  1   Para  1   Term  1   Preterm      AB      Living  1     SAB      IAB      Ectopic      Multiple  0   Live Births  1           No family history on file.  Social History   Tobacco Use  . Smoking status: Never Smoker  . Smokeless tobacco: Never Used  Substance Use Topics  . Alcohol use: No  . Drug use: No  Home Medications Prior to Admission medications   Medication Sig Start Date End Date Taking? Authorizing Provider  oxyCODONE-acetaminophen (PERCOCET/ROXICET) 5-325 MG tablet Take 1-2 tablets by mouth every 6 (six) hours as needed for severe pain. 12/17/20  Yes Shandrika Ambers, [place] N, PA-C  cetirizine (ZYRTEC) 10 MG tablet Take 1 tablet (10 mg total) by mouth daily. 08/01/19   [name], MD  FLOVENT HFA 110 MCG/ACT inhaler Inhale 2 puffs into the lungs 2 (two) times daily. 08/01/19   [name], MD  fluticasone (FLONASE) 50 MCG/ACT nasal spray Place 1 spray into both nostrils daily. 1 spray in each nostril every day 08/01/19   [name], MD  montelukast (SINGULAIR) 10 MG tablet Take 1 tablet (10 mg total) by mouth at bedtime. 08/01/19   [name], MD  norgestimate-ethinyl estradiol (ORTHO-CYCLEN,SPRINTEC,PREVIFEM) 0.25-35 MG-MCG tablet Take 1 tablet by mouth daily. Patient not taking: Reported on 08/01/2019 01/25/18   Pincus Large, DO  PROAIR  HFA 108 [phone] Base) MCG/ACT inhaler TAKE 2 PUFFS BY MOUTH EVERY 6 HOURS AS NEEDED FOR WHEEZE OR SHORTNESS OF BREATH 05/16/19   [name], MD  triamcinolone ointment (KENALOG) 0.1 % Apply 1 application topically 2 (two) times daily. 08/01/19   [name], MD    Allergies    Lactose intolerance (gi), Penicillins, Eggs or egg-derived products, Latex, and Peanut-containing drug products  Review of Systems   Review of Systems  Musculoskeletal: Positive for arthralgias.  Skin: Negative for wound.    Physical Exam Updated Vital Signs BP (!) 136/95   Pulse 88   Temp 98.6 F (37 C)   Resp 18   SpO2 100%   Physical Exam Vitals and nursing note reviewed.  Constitutional:      General: She is not in acute distress.    Appearance: She is well-developed.  HENT:     Head: Normocephalic and atraumatic.  Eyes:     Conjunctiva/sclera: Conjunctivae normal.  Cardiovascular:     Rate and Rhythm: Normal rate.  Pulmonary:     Effort: Pulmonary effort is normal.  Musculoskeletal:     Comments: Right elbow without obvious deformity.  Patient is holding right elbow in a flexed position.  There is some palpable swelling noted mostly to the lateral aspect of the elbow and tenderness is noted here as well it in the [facility] region.  No bony tenderness to the olecranon process or medial epicondyle.  Unable to range the elbow much 2/t pain. Wrist is normal.  Strong radial pulse and normal distal sensation.  No wound.  Neurological:     Mental Status: She is alert.  Psychiatric:        Mood and Affect: Mood normal.        Behavior: Behavior normal.     ED Results / Procedures / Treatments   Labs (all labs ordered are listed, but only abnormal results are displayed) Labs Reviewed - No data to display  EKG None  Radiology DG Elbow Complete Right  Result Date: 12/17/2020 CLINICAL DATA:  Right elbow pain after fall yesterday. EXAM: RIGHT ELBOW - COMPLETE 3+ VIEW COMPARISON:  None. FINDINGS:  There is a small elbow joint effusion. No definite fracture line is seen. Normal alignment and joint spaces. No focal soft tissue abnormality. IMPRESSION: Small elbow joint effusion without definite fracture line. Findings may represent radiographically occult fracture. Consider follow-up radiographs in 7-10 days. Electronically Signed   By: Narda Rutherford M.D.   On: 12/17/2020 21:03    Procedures Procedures  Medications Ordered in ED Medications  ibuprofen (ADVIL) tablet 600 mg (600 mg Oral Given 12/17/20 2101)    ED Course  I have reviewed the triage vital signs and the nursing notes.  Pertinent labs & imaging results that were available during my care of the patient were reviewed by me and considered in my medical decision making (see chart for details).    MDM Rules/Calculators/A&P                          Patient presenting with right elbow injury after mechanical fall while skating yesterday.  There is some swelling noted patient has quite a bit of pain with range of motion.  Neurovascular intact, no wounds.  X-ray shows small joint effusion without obvious fracture noted though there is concern for occult fracture.  Therefore patient was placed in long-arm posterior splint, sling, and provided with referral to orthopedics for follow-up and 1 week for repeat imaging.  Patient verbalized understanding of this and is in agreement with plan.  She will be prescribed pain medication for symptom relief, also recommend ice and NSAIDs.  Patient is discharged in no distress.  [place] Controlled Substance reporting System queried  Final Clinical Impression(s) / ED Diagnoses Final diagnoses:  Elbow injury, right, initial encounter    Rx / DC Orders ED Discharge Orders         Ordered    oxyCODONE-acetaminophen (PERCOCET/ROXICET) 5-325 MG tablet  Every 6 hours PRN        12/17/20 2150           Jemel Ono, [place] N, PA-C 12/17/20 2151    Pollyann Savoy, MD 12/17/20  [phone]

## 2020-12-17 NOTE — Progress Notes (Signed)
Orthopedic Tech Progress Note Patient Details:  Amanda Daniel November 19, 2000 [id number]  Ortho Devices Type of Ortho Device: Arm sling,Post (long arm) splint Ortho Device/Splint Location: rue Ortho Device/Splint Interventions: Ordered,Application,Adjustment   Post Interventions Patient Tolerated: Well Instructions Provided: Adjustment of device,Care of device   Trinna Post 12/17/2020, 11:11 PM

## 2020-12-17 NOTE — ED Notes (Signed)
Patient given discharge paperwork and instructions. Verbalized understanding of teaching. No IV access. Ambulatory to exit in NAD with steady gait. 

## 2020-12-17 NOTE — ED Triage Notes (Signed)
Pt reports R arm/elbow pain after falling at the skating rink yesterday.

## 2020-12-25 DIAGNOSIS — S52041A Displaced fracture of coronoid process of right ulna, initial encounter for closed fracture: Secondary | ICD-10-CM | POA: Insufficient documentation

## 2020-12-25 DIAGNOSIS — M25521 Pain in right elbow: Secondary | ICD-10-CM | POA: Insufficient documentation

## 2021-01-12 ENCOUNTER — Other Ambulatory Visit: Payer: Self-pay

## 2021-01-12 ENCOUNTER — Encounter (HOSPITAL_COMMUNITY): Payer: Self-pay

## 2021-01-12 ENCOUNTER — Ambulatory Visit (HOSPITAL_COMMUNITY)
Admission: EM | Admit: 2021-01-12 | Discharge: 2021-01-12 | Disposition: A | Discharge status: Home / Self Care | Payer: Medicaid Other | Attending: Physician Assistant | Admitting: Physician Assistant

## 2021-01-12 DIAGNOSIS — R112 Nausea with vomiting, unspecified: Secondary | ICD-10-CM | POA: Diagnosis not present

## 2021-01-12 DIAGNOSIS — Z3202 Encounter for pregnancy test, result negative: Secondary | ICD-10-CM

## 2021-01-12 LAB — POC URINE PREG, ED: Preg Test, Ur: NEGATIVE

## 2021-01-12 LAB — POCT URINALYSIS DIPSTICK, ED / UC
Bilirubin Urine: NEGATIVE
Glucose, UA: NEGATIVE mg/dL
Hgb urine dipstick: NEGATIVE
Ketones, ur: NEGATIVE mg/dL
Leukocytes,Ua: NEGATIVE
Nitrite: NEGATIVE
Protein, ur: NEGATIVE mg/dL
Specific Gravity, Urine: 1.025 (ref 1.005–1.030)
Urobilinogen, UA: 1 mg/dL (ref 0.0–1.0)
pH: 5.5 (ref 5.0–8.0)

## 2021-01-12 MED ORDER — ONDANSETRON 4 MG PO TBDP: 4.0000 mg | ORAL_TABLET | Freq: Three times a day (TID) | ORAL | 0 refills | Status: DC | PRN

## 2021-01-12 MED ORDER — FAMOTIDINE 20 MG PO TABS: 20.0000 mg | ORAL_TABLET | Freq: Two times a day (BID) | ORAL | 0 refills | Status: DC

## 2021-01-12 NOTE — ED Triage Notes (Signed)
Pt reports vomiting after eating and drinking x 1 week.

## 2021-01-12 NOTE — ED Provider Notes (Signed)
[facility]    CSN: [id number] Arrival date & time: 01/12/21  1714      History   Chief Complaint Chief Complaint  Patient presents with  . Emesis    HPI Amanda Daniel is a 20 y.o. female.   Patient presents today with a 1 week history of intermittent nausea and vomiting.  Reports symptoms seem to be triggered by oral intake but she has been able to successfully keep food and drink down despite symptoms.  She reports emesis is food contents.  She denies any abdominal pain, hematemesis, diarrhea, fever, congestion symptoms, vaginal symptoms, urinary symptoms.  She has not tried any over-the-counter medication for symptom management has been drinking ginger ale without improvement.  She denies history of gastrointestinal disorder including acid reflux.  LMP 12/17/2020.  She denies any suspicious food intake, recent medication changes, antibiotic use, recent travel.  She denies previous abdominal surgery.     Past Medical History:  Diagnosis Date  . Allergic rhinitis   . Asthma    hasnt used rescue inhaler in months  . Atopic dermatitis 2014  . Chlamydia infection affecting pregnancy 06/17/2017   Was treated 06/15/17, partner also treated [x ] TOC > check at 20 wk visit> neg  . Obesity 2013  . Preeclampsia, severe, third trimester 11/09/2017  . Pregnancy affected by fetal growth restriction 11/03/2017       Guidelines for Antenatal Testing and Sonography  (with updated ICD-10 codes) INDICATION U/S NST/AFI DELIVERY IUGR- O36.5990   EFW < 10% w/ AEDF & low AFV or EFW < 3%     EFW < 10%, Nml Dopplers & AFV, AC<3%, no other comorbidities  PRN  [id number]  Inpatient  24//BPP+dopplers wkly  PRN  PRN or 39   . Supervision of high risk pregnancy, antepartum 05/20/2017    Clinic [facility] Prenatal Labs Dating 1st trimester u/s Blood type: O/Positive/-- (08/30 0859) O pos Genetic Screen 1 Screen:    AFP:     Quad:     NIPS: Antibody:Negative (08/30 0859)neg Anatomic [place]   normal except velamentous cord Rubella: 3.79 (08/30 0859)Immune GTT Early:               Third trimester:  RPR: Non Reactive (08/30 0859) NR Flu vaccine  06/17/17 HBsAg: Negative (08/30 0859) Neg TDaP   . Supervision of normal first teen pregnancy 05/20/2017  . 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 Obesity in pregnancy, antepartum 09/29/2017  . Allergic rhinitis 03/27/2014  . Atopic dermatitis 03/27/2014  . BMI (body mass index), pediatric, > 99% for age 27/09/2013  . Asthma, moderate persistent, well-controlled 12/20/2013  . Failed hearing screening 12/20/2013  . Acne 12/20/2013    Past Surgical History:  Procedure Laterality Date  . TONSILLECTOMY      OB History    Gravida  1   Para  1   Term  1   Preterm      AB      Living  1     SAB      IAB      Ectopic      Multiple  0   Live Births  1            Home Medications    Prior to Admission medications   Medication Sig Start Date End Date Taking? Authorizing Provider  famotidine (PEPCID) 20 MG tablet Take 1 tablet (20 mg total) by mouth 2 (two) times daily. 01/12/21  Yes Jabril Pursell K, PA-C  ondansetron (ZOFRAN ODT) 4 MG disintegrating tablet Take 1 tablet (4 mg total) by mouth every 8 (eight) hours as needed for nausea or vomiting. 01/12/21  Yes Jorey Dollard K, PA-C  cetirizine (ZYRTEC) 10 MG tablet Take 1 tablet (10 mg total) by mouth daily. 08/01/19   [name], MD  FLOVENT HFA 110 MCG/ACT inhaler Inhale 2 puffs into the lungs 2 (two) times daily. 08/01/19   [name], MD  fluticasone (FLONASE) 50 MCG/ACT nasal spray Place 1 spray into both nostrils daily. 1 spray in each nostril every day 08/01/19   [name], MD  montelukast (SINGULAIR) 10 MG tablet Take 1 tablet (10 mg total) by mouth at bedtime. 08/01/19   [name], MD  norgestimate-ethinyl estradiol  (ORTHO-CYCLEN,SPRINTEC,PREVIFEM) 0.25-35 MG-MCG tablet Take 1 tablet by mouth daily. Patient not taking: No sig reported 01/25/18   Pincus Large, DO  oxyCODONE-acetaminophen (PERCOCET/ROXICET) 5-325 MG tablet Take 1-2 tablets by mouth every 6 (six) hours as needed for severe pain. 12/17/20   Robinson, [place] N, PA-C  PROAIR HFA 108 [phone] Base) MCG/ACT inhaler TAKE 2 PUFFS BY MOUTH EVERY 6 HOURS AS NEEDED FOR WHEEZE OR SHORTNESS OF BREATH 05/16/19   [name], MD  triamcinolone ointment (KENALOG) 0.1 % Apply 1 application topically 2 (two) times daily. 08/01/19   [name], MD    Family History History reviewed. No pertinent family history.  Social History Social History   Tobacco Use  . Smoking status: Never Smoker  . Smokeless tobacco: Never Used  Substance Use Topics  . Alcohol use: No  . Drug use: No     Allergies   Lactose intolerance (gi), Penicillins, Eggs or egg-derived products, Latex, and Peanut-containing drug products   Review of Systems Review of Systems  Constitutional: Positive for appetite change. Negative for activity change, fatigue and fever.  HENT: Negative for congestion, sinus pressure, sneezing and sore throat.   Respiratory: Negative for cough and shortness of breath.   Cardiovascular: Negative for chest pain.  Gastrointestinal: Positive for nausea and vomiting. Negative for abdominal pain and diarrhea.  Musculoskeletal: Negative for arthralgias and myalgias.  Neurological: Negative for dizziness, light-headedness and headaches.     Physical Exam Triage Vital Signs ED Triage Vitals  Enc Vitals Group     BP 01/12/21 1740 130/85     Pulse Rate 01/12/21 1740 98     Resp 01/12/21 1740 18     Temp 01/12/21 1740 98.2 F (36.8 C)     Temp Source 01/12/21 1740 Oral     SpO2 01/12/21 1740 97 %     Weight --      Height --      Head Circumference --      Peak Flow --      Pain Score 01/12/21 1739 0     Pain Loc --      Pain Edu? --       Excl. in GC? --    No data found.  Updated Vital Signs BP 130/85 (BP Location: Right Wrist)   Pulse 98   Temp 98.2 F (36.8 C) (Oral)   Resp 18   LMP 12/17/2020 (Exact Date)   SpO2 97%   Visual Acuity Right Eye Distance:   Left Eye Distance:   Bilateral Distance:    Right Eye Near:   Left Eye Near:  Bilateral Near:     Physical Exam Vitals reviewed.  Constitutional:      General: She is awake. She is not in acute distress.    Appearance: Normal appearance. She is not ill-appearing.     Comments: Very pleasant female appears stated age in no acute distress  HENT:     Head: Normocephalic and atraumatic.  Cardiovascular:     Rate and Rhythm: Normal rate and regular rhythm.     Heart sounds: No murmur heard.   Pulmonary:     Effort: Pulmonary effort is normal.     Breath sounds: Normal breath sounds. No wheezing, rhonchi or rales.     Comments: Clear to auscultation bilaterally Abdominal:     General: Bowel sounds are normal.     Palpations: Abdomen is soft.     Tenderness: There is no abdominal tenderness. There is no right CVA tenderness, left CVA tenderness, guarding or rebound.     Comments: Benign abdominal exam; no tenderness to palpation.  Psychiatric:        Behavior: Behavior is cooperative.      UC Treatments / Results  Labs (all labs ordered are listed, but only abnormal results are displayed) Labs Reviewed  POC URINE PREG, ED  POCT URINALYSIS DIPSTICK, ED / UC    EKG   Radiology No results found.  Procedures Procedures (including critical care time)  Medications Ordered in UC Medications - No data to display  Initial Impression / Assessment and Plan / UC Course  I have reviewed the triage vital signs and the nursing notes.  Pertinent labs & imaging results that were available during my care of the patient were reviewed by me and considered in my medical decision making (see chart for details).     Urine pregnancy test was  negative in office today.  UA normal.  Vital signs and physical exam reassuring today; no indication for emergent evaluation or imaging.  Patient was prescribed famotidine and encouraged to eat a bland diet.  She was given Zofran to help manage nausea symptoms.  Discussed the importance of drinking plenty of fluid.  She was given contact information for gastroenterologist and encouraged to call them if symptoms persist for further evaluation and management.  Strict return precautions given to which patient expressed understanding.  Final Clinical Impressions(s) / UC Diagnoses   Final diagnoses:  Nausea and vomiting, intractability of vomiting not specified, unspecified vomiting type     Discharge Instructions     Take famotidine to help coat your stomach.  Eat a bland diet by avoiding spicy/acidic/fatty foods.  Use Zofran to manage nausea.  You may need to see a GI specialist (a stomach doctor) so please call specialist and schedule an appointment for further evaluation.  If you have any severe abdominal pain, persistent nausea/vomiting preventing you from eating, fever you need to be seen immediately.    ED Prescriptions    Medication Sig Dispense Auth. Provider   ondansetron (ZOFRAN ODT) 4 MG disintegrating tablet Take 1 tablet (4 mg total) by mouth every 8 (eight) hours as needed for nausea or vomiting. 20 tablet Zakarie Sturdivant K, PA-C   famotidine (PEPCID) 20 MG tablet Take 1 tablet (20 mg total) by mouth 2 (two) times daily. 30 tablet Stefano Trulson, Noberto Retort, PA-C     PDMP not reviewed this encounter.   Jeani Hawking, PA-C 01/12/21 1857

## 2021-01-12 NOTE — Discharge Instructions (Addendum)
Take famotidine to help coat your stomach.  Eat a bland diet by avoiding spicy/acidic/fatty foods.  Use Zofran to manage nausea.  You may need to see a GI specialist (a stomach doctor) so please call specialist and schedule an appointment for further evaluation.  If you have any severe abdominal pain, persistent nausea/vomiting preventing you from eating, fever you need to be seen immediately.

## 2021-03-10 ENCOUNTER — Encounter (HOSPITAL_COMMUNITY): Payer: Self-pay

## 2021-03-10 ENCOUNTER — Ambulatory Visit (HOSPITAL_COMMUNITY): Payer: Medicaid Other

## 2021-03-10 ENCOUNTER — Ambulatory Visit (HOSPITAL_COMMUNITY)
Admission: EM | Admit: 2021-03-10 | Discharge: 2021-03-10 | Disposition: A | Discharge status: Home / Self Care | Payer: Medicaid Other | Attending: Family Medicine | Admitting: Family Medicine

## 2021-03-10 ENCOUNTER — Other Ambulatory Visit: Payer: Self-pay

## 2021-03-10 DIAGNOSIS — Z113 Encounter for screening for infections with a predominantly sexual mode of transmission: Secondary | ICD-10-CM

## 2021-03-10 LAB — POCT URINALYSIS DIPSTICK, ED / UC
Bilirubin Urine: NEGATIVE
Glucose, UA: NEGATIVE mg/dL
Hgb urine dipstick: NEGATIVE
Ketones, ur: NEGATIVE mg/dL
Leukocytes,Ua: NEGATIVE
Nitrite: NEGATIVE
Protein, ur: NEGATIVE mg/dL
Specific Gravity, Urine: 1.025 (ref 1.005–1.030)
Urobilinogen, UA: 0.2 mg/dL (ref 0.0–1.0)
pH: 6 (ref 5.0–8.0)

## 2021-03-10 LAB — POC URINE PREG, ED: Preg Test, Ur: NEGATIVE

## 2021-03-10 NOTE — ED Triage Notes (Signed)
Pt presents for STD Testing with no complaints of any symptoms.

## 2021-03-10 NOTE — Discharge Instructions (Signed)
Your vaginal tests are pending.  If your test results are positive, we will call you.  You and your sexual partner(s) may require treatment at that time.  Do not have sexual activity for at least 7 days.     

## 2021-03-10 NOTE — ED Provider Notes (Signed)
[facility]    CSN: [id number] Arrival date & time: 03/10/21  0944      History   Chief Complaint Chief Complaint  Patient presents with   APPOINTMENT : STD Testing     HPI Amanda Daniel is a 20 y.o. female.   Presents for STD check today. States that she is sexually active with men and women. Denies vaginal discharge, odor, irritation, abnormal vaginal bleeding, pelvic pain, other symptoms. Denies being told by partners that she has been exposed to an STD.  ROS per HPI  The history is provided by the patient.   Past Medical History:  Diagnosis Date   Allergic rhinitis    Asthma    hasnt used rescue inhaler in months   Atopic dermatitis 2014   Chlamydia infection affecting pregnancy 06/17/2017   Was treated 06/15/17, partner also treated [x ] TOC > check at 20 wk visit> neg   Obesity 2013   Preeclampsia, severe, third trimester 11/09/2017   Pregnancy affected by fetal growth restriction 11/03/2017       Guidelines for Antenatal Testing and Sonography  (with updated ICD-10 codes) INDICATION U/S NST/AFI DELIVERY IUGR- O36.5990   EFW < 10% w/ AEDF & low AFV or EFW < 3%     EFW < 10%, Nml Dopplers & AFV, AC<3%, no other comorbidities  PRN  [id number]  Inpatient  24//BPP+dopplers wkly  PRN  PRN or 39    Supervision of high risk pregnancy, antepartum 05/20/2017    Clinic [facility] Prenatal Labs Dating 1st trimester u/s Blood type: O/Positive/-- (08/30 0859) O pos Genetic Screen 1 Screen:    AFP:     Quad:     NIPS: Antibody:Negative (08/30 0859)neg Anatomic [place]  normal except velamentous cord Rubella: 3.79 (08/30 0859)Immune GTT Early:               Third trimester:  RPR: Non Reactive (08/30 0859) NR Flu vaccine  06/17/17 HBsAg: Negative (08/30 0859) Neg TDaP    Supervision of normal first teen pregnancy 05/20/2017   Velamentous insertion of umbilical cord, 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   [place] for growth @25  wks > SGA at 11%    Patient Active Problem List   Diagnosis Date  Noted   Obesity in pregnancy, antepartum 09/29/2017   Allergic rhinitis 03/27/2014   Atopic dermatitis 03/27/2014   BMI (body mass index), pediatric, > 99% for age 33/09/2013   Asthma, moderate persistent, well-controlled 12/20/2013   Failed hearing screening 12/20/2013   Acne 12/20/2013    Past Surgical History:  Procedure Laterality Date   TONSILLECTOMY      OB History     Gravida  1   Para  1   Term  1   Preterm      AB      Living  1      SAB      IAB      Ectopic      Multiple  0   Live Births  1            Home Medications    Prior to Admission medications   Medication Sig Start Date End Date Taking? Authorizing Provider  cetirizine (ZYRTEC) 10 MG tablet Take 1 tablet (10 mg total) by mouth daily. 08/01/19   13/10/20, MD  famotidine (PEPCID) 20 MG tablet Take 1 tablet (20 mg total) by mouth 2 (two) times daily. 01/12/21   Raspet, 01/14/21, PA-C  FLOVENT HFA 110 MCG/ACT inhaler Inhale 2  puffs into the lungs 2 (two) times daily. 08/01/19   [name], MD  fluticasone (FLONASE) 50 MCG/ACT nasal spray Place 1 spray into both nostrils daily. 1 spray in each nostril every day 08/01/19   [name], MD  montelukast (SINGULAIR) 10 MG tablet Take 1 tablet (10 mg total) by mouth at bedtime. 08/01/19   [name], MD  norgestimate-ethinyl estradiol (ORTHO-CYCLEN,SPRINTEC,PREVIFEM) 0.25-35 MG-MCG tablet Take 1 tablet by mouth daily. Patient not taking: No sig reported 01/25/18   Pincus Large, DO  ondansetron (ZOFRAN ODT) 4 MG disintegrating tablet Take 1 tablet (4 mg total) by mouth every 8 (eight) hours as needed for nausea or vomiting. 01/12/21   Raspet, Noberto Retort, PA-C  oxyCODONE-acetaminophen (PERCOCET/ROXICET) 5-325 MG tablet Take 1-2 tablets by mouth every 6 (six) hours as needed for severe pain. 12/17/20   Robinson, [place] N, PA-C  PROAIR HFA 108 [phone] Base) MCG/ACT inhaler TAKE 2 PUFFS BY MOUTH EVERY 6 HOURS AS NEEDED FOR WHEEZE OR  SHORTNESS OF BREATH 05/16/19   [name], MD  triamcinolone ointment (KENALOG) 0.1 % Apply 1 application topically 2 (two) times daily. 08/01/19   [name], MD    Family History History reviewed. No pertinent family history.  Social History Social History   Tobacco Use   Smoking status: Never   Smokeless tobacco: Never  Substance Use Topics   Alcohol use: No   Drug use: No     Allergies   Lactose intolerance (gi), Penicillins, Eggs or egg-derived products, Latex, and Peanut-containing drug products   Review of Systems Review of Systems   Physical Exam Triage Vital Signs ED Triage Vitals [03/10/21 1020]  Enc Vitals Group     BP      Pulse      Resp      Temp      Temp src      SpO2      Weight      Height      Head Circumference      Peak Flow      Pain Score 0     Pain Loc      Pain Edu?      Excl. in GC?    No data found.  Updated Vital Signs There were no vitals taken for this visit.  Visual Acuity Right Eye Distance:   Left Eye Distance:   Bilateral Distance:    Right Eye Near:   Left Eye Near:    Bilateral Near:     Physical Exam Vitals and nursing note reviewed.  Constitutional:      General: She is not in acute distress.    Appearance: Normal appearance. She is well-developed.  HENT:     Head: Normocephalic and atraumatic.     Nose: Nose normal.     Mouth/Throat:     Mouth: Mucous membranes are moist.     Pharynx: Oropharynx is clear.  Eyes:     Extraocular Movements: Extraocular movements intact.     Conjunctiva/sclera: Conjunctivae normal.     Pupils: Pupils are equal, round, and reactive to light.  Cardiovascular:     Rate and Rhythm: Normal rate and regular rhythm.  Pulmonary:     Effort: Pulmonary effort is normal. No respiratory distress.  Genitourinary:    Comments: Deferred  Musculoskeletal:        General: Normal range of motion.     Cervical back: Normal range of motion and neck supple.  Skin:  General: Skin is warm and dry.     Capillary Refill: Capillary refill takes less than 2 seconds.  Neurological:     General: No focal deficit present.     Mental Status: She is alert and oriented to person, place, and time.  Psychiatric:        Mood and Affect: Mood normal.        Behavior: Behavior normal.        Thought Content: Thought content normal.     UC Treatments / Results  Labs (all labs ordered are listed, but only abnormal results are displayed) Labs Reviewed  POCT URINALYSIS DIPSTICK, ED / UC  POC URINE PREG, ED  CERVICOVAGINAL ANCILLARY ONLY    EKG   Radiology No results found.  Procedures Procedures (including critical care time)  Medications Ordered in UC Medications - No data to display  Initial Impression / Assessment and Plan / UC Course  I have reviewed the triage vital signs and the nursing notes.  Pertinent labs & imaging results that were available during my care of the patient were reviewed by me and considered in my medical decision making (see chart for details).    STD Screen  UA negative for infection in office today Urine pregnancy test negative Vaginal swab will result in 3-5 days We will be in contact with any abnormal labs that require further treatment Do not have sex until results area back and negative or until treatment is completed, whichever is longer Follow up with this office or with primary care if symptoms are persisting.  Follow up in the ER for high fever, trouble swallowing, trouble breathing, other concerning symptoms.   Final Clinical Impressions(s) / UC Diagnoses   Final diagnoses:  Screen for STD (sexually transmitted disease)     Discharge Instructions      Your vaginal tests are pending.   If your test results are positive, we will call you.    You and your sexual partner(s) may require treatment at that time.    Do not have sexual activity for at least 7 days.          ED Prescriptions    None    PDMP not reviewed this encounter.   Moshe Cipro, NP 03/10/21 1134

## 2021-03-11 LAB — CERVICOVAGINAL ANCILLARY ONLY
Bacterial Vaginitis (gardnerella): POSITIVE — AB
Candida Glabrata: NEGATIVE
Candida Vaginitis: POSITIVE — AB
Chlamydia: POSITIVE — AB
Comment: NEGATIVE
Comment: NEGATIVE
Comment: NEGATIVE
Comment: NEGATIVE
Comment: NEGATIVE
Comment: NORMAL
Neisseria Gonorrhea: POSITIVE — AB
Trichomonas: NEGATIVE

## 2021-03-13 ENCOUNTER — Telehealth (HOSPITAL_COMMUNITY): Payer: Self-pay

## 2021-03-13 DIAGNOSIS — A749 Chlamydial infection, unspecified: Secondary | ICD-10-CM

## 2021-03-13 DIAGNOSIS — B3731 Acute candidiasis of vulva and vagina: Secondary | ICD-10-CM

## 2021-03-13 DIAGNOSIS — B373 Candidiasis of vulva and vagina: Secondary | ICD-10-CM

## 2021-03-13 DIAGNOSIS — B9689 Other specified bacterial agents as the cause of diseases classified elsewhere: Secondary | ICD-10-CM

## 2021-03-13 DIAGNOSIS — A549 Gonococcal infection, unspecified: Secondary | ICD-10-CM

## 2021-03-13 DIAGNOSIS — N760 Acute vaginitis: Secondary | ICD-10-CM

## 2021-03-13 MED ORDER — METRONIDAZOLE 500 MG PO TABS: 500.0000 mg | ORAL_TABLET | Freq: Two times a day (BID) | ORAL | 0 refills | Status: DC

## 2021-03-13 MED ORDER — FLUCONAZOLE 150 MG PO TABS: ORAL_TABLET | ORAL | 0 refills | Status: DC

## 2021-03-13 MED ORDER — DOXYCYCLINE HYCLATE 100 MG PO CAPS: 100.0000 mg | ORAL_CAPSULE | Freq: Two times a day (BID) | ORAL | 0 refills | Status: DC

## 2021-03-13 NOTE — Telephone Encounter (Signed)
Returned patients call to discuss treatment for Lab result. No answer. Unable to leave message.

## 2021-08-22 ENCOUNTER — Other Ambulatory Visit: Payer: Self-pay

## 2021-08-22 ENCOUNTER — Ambulatory Visit (HOSPITAL_COMMUNITY)
Admission: EM | Admit: 2021-08-22 | Discharge: 2021-08-22 | Disposition: A | Discharge status: Home / Self Care | Payer: Medicaid Other | Attending: Physician Assistant | Admitting: Physician Assistant

## 2021-08-22 ENCOUNTER — Encounter (HOSPITAL_COMMUNITY): Payer: Self-pay

## 2021-08-22 DIAGNOSIS — J069 Acute upper respiratory infection, unspecified: Secondary | ICD-10-CM | POA: Diagnosis not present

## 2021-08-22 DIAGNOSIS — R051 Acute cough: Secondary | ICD-10-CM | POA: Diagnosis present

## 2021-08-22 DIAGNOSIS — Z20822 Contact with and (suspected) exposure to covid-19: Secondary | ICD-10-CM | POA: Diagnosis not present

## 2021-08-22 DIAGNOSIS — R509 Fever, unspecified: Secondary | ICD-10-CM | POA: Diagnosis present

## 2021-08-22 LAB — RESPIRATORY PANEL BY PCR

## 2021-08-22 LAB — SARS CORONAVIRUS 2 (TAT 6-24 HRS): SARS Coronavirus 2: NEGATIVE

## 2021-08-22 NOTE — ED Provider Notes (Signed)
[facility]    CSN: [id number] Arrival date & time: 08/22/21  4496      History   Chief Complaint Chief Complaint  Patient presents with   Cough   Fever    HPI Amanda Daniel is a 20 y.o. female.   Patient here today for evaluation of nasal congestion, cough, fever that started 2 days ago.  She states initially she had mild sore throat but this cleared.  She has had some nausea and vomiting but no diarrhea.  She states she had a T-max of 102.  She has tried over-the-counter medication without significant relief.  The history is provided by the patient.  Cough Associated symptoms: ear pain (mild right), fever and sore throat   Associated symptoms: no eye discharge, no shortness of breath and no wheezing   Fever Associated symptoms: congestion, cough, ear pain (mild right), nausea, sore throat and vomiting   Associated symptoms: no diarrhea    Past Medical History:  Diagnosis Date   Allergic rhinitis    Asthma    hasnt used rescue inhaler in months   Atopic dermatitis 2014   Chlamydia infection affecting pregnancy 06/17/2017   Was treated 06/15/17, partner also treated [x ] TOC > check at 20 wk visit> neg   Obesity 2013   Preeclampsia, severe, third trimester 11/09/2017   Pregnancy affected by fetal growth restriction 11/03/2017       Guidelines for Antenatal Testing and Sonography  (with updated ICD-10 codes) INDICATION U/S NST/AFI DELIVERY IUGR- O36.5990   EFW < 10% w/ AEDF & low AFV or EFW < 3%     EFW < 10%, Nml Dopplers & AFV, AC<3%, no other comorbidities  PRN  [id number]  Inpatient  24//BPP+dopplers wkly  PRN  PRN or 39    Supervision of high risk pregnancy, antepartum 05/20/2017    Clinic [facility] Prenatal Labs Dating 1st trimester u/s Blood type: O/Positive/-- (08/30 0859) O pos Genetic Screen 1 Screen:    AFP:     Quad:     NIPS: Antibody:Negative (08/30 0859)neg Anatomic [place]  normal except velamentous cord Rubella: 3.79 (08/30 0859)Immune  GTT Early:               Third trimester:  RPR: Non Reactive (08/30 0859) NR Flu vaccine  06/17/17 HBsAg: Negative (08/30 0859) Neg TDaP    Supervision of normal first teen pregnancy 05/20/2017   Velamentous insertion of umbilical cord, 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   [place] for growth @25  wks > SGA at 11%    Patient Active Problem List   Diagnosis Date Noted   Obesity in pregnancy, antepartum 09/29/2017   Allergic rhinitis 03/27/2014   Atopic dermatitis 03/27/2014   BMI (body mass index), pediatric, > 99% for age 19/09/2013   Asthma, moderate persistent, well-controlled 12/20/2013   Failed hearing screening 12/20/2013   Acne 12/20/2013    Past Surgical History:  Procedure Laterality Date   TONSILLECTOMY      OB History     Gravida  1   Para  1   Term  1   Preterm      AB      Living  1      SAB      IAB      Ectopic      Multiple  0   Live Births  1            Home Medications    Prior to Admission medications   Medication Sig Start  Date End Date Taking? Authorizing Provider  cetirizine (ZYRTEC) 10 MG tablet Take 1 tablet (10 mg total) by mouth daily. 08/01/19   [name], MD  doxycycline (VIBRAMYCIN) 100 MG capsule Take 1 capsule (100 mg total) by mouth 2 (two) times daily. 03/13/21   Moshe Cipro, NP  famotidine (PEPCID) 20 MG tablet Take 1 tablet (20 mg total) by mouth 2 (two) times daily. 01/12/21   Raspet, Noberto Retort, PA-C  FLOVENT HFA 110 MCG/ACT inhaler Inhale 2 puffs into the lungs 2 (two) times daily. 08/01/19   [name], MD  fluconazole (DIFLUCAN) 150 MG tablet Take one tablet at the onset of symptoms. If symptoms are still present 3 days later, take the second tablet. 03/13/21   Moshe Cipro, NP  fluticasone (FLONASE) 50 MCG/ACT nasal spray Place 1 spray into both nostrils daily. 1 spray in each nostril every day 08/01/19   [name], MD  metroNIDAZOLE (FLAGYL) 500 MG tablet Take 1 tablet (500 mg total) by mouth 2 (two)  times daily. 03/13/21   Moshe Cipro, NP  montelukast (SINGULAIR) 10 MG tablet Take 1 tablet (10 mg total) by mouth at bedtime. 08/01/19   [name], MD  norgestimate-ethinyl estradiol (ORTHO-CYCLEN,SPRINTEC,PREVIFEM) 0.25-35 MG-MCG tablet Take 1 tablet by mouth daily. Patient not taking: Reported on 08/01/2019 01/25/18   Pincus Large, DO  ondansetron (ZOFRAN ODT) 4 MG disintegrating tablet Take 1 tablet (4 mg total) by mouth every 8 (eight) hours as needed for nausea or vomiting. 01/12/21   Raspet, Noberto Retort, PA-C  oxyCODONE-acetaminophen (PERCOCET/ROXICET) 5-325 MG tablet Take 1-2 tablets by mouth every 6 (six) hours as needed for severe pain. 12/17/20   Robinson, [place] N, PA-C  PROAIR HFA 108 [phone] Base) MCG/ACT inhaler TAKE 2 PUFFS BY MOUTH EVERY 6 HOURS AS NEEDED FOR WHEEZE OR SHORTNESS OF BREATH 05/16/19   [name], MD  triamcinolone ointment (KENALOG) 0.1 % Apply 1 application topically 2 (two) times daily. 08/01/19   [name], MD    Family History History reviewed. No pertinent family history.  Social History Social History   Tobacco Use   Smoking status: Never   Smokeless tobacco: Never  Substance Use Topics   Alcohol use: No   Drug use: No     Allergies   Lactose intolerance (gi), Penicillins, Eggs or egg-derived products, Latex, and Peanut-containing drug products   Review of Systems Review of Systems  Constitutional:  Positive for fever.  HENT:  Positive for congestion, ear pain (mild right) and sore throat.   Eyes:  Negative for discharge and redness.  Respiratory:  Positive for cough. Negative for shortness of breath and wheezing.   Gastrointestinal:  Positive for nausea and vomiting. Negative for abdominal pain and diarrhea.    Physical Exam Triage Vital Signs ED Triage Vitals [08/22/21 1046]  Enc Vitals Group     BP 123/87     Pulse Rate (!) 125     Resp 18     Temp 99.3 F (37.4 C)     Temp Source Oral     SpO2 100 %      Weight      Height      Head Circumference      Peak Flow      Pain Score 0     Pain Loc      Pain Edu?      Excl. in GC?    No data found.  Updated Vital Signs BP 123/87 (BP Location: Right Arm)  Pulse (!) 125   Temp 99.3 F (37.4 C) (Oral)   Resp 18   LMP 08/12/2021 (Approximate)   SpO2 100%     Physical Exam Vitals and nursing note reviewed.  Constitutional:      General: She is not in acute distress.    Appearance: Normal appearance. She is not ill-appearing.  HENT:     Head: Normocephalic and atraumatic.     Right Ear: Tympanic membrane normal.     Left Ear: Tympanic membrane normal.     Nose: Congestion present.     Mouth/Throat:     Mouth: Mucous membranes are moist.     Pharynx: No oropharyngeal exudate or posterior oropharyngeal erythema.  Eyes:     Conjunctiva/sclera: Conjunctivae normal.  Cardiovascular:     Rate and Rhythm: Normal rate and regular rhythm.     Heart sounds: Normal heart sounds. No murmur heard. Pulmonary:     Effort: Pulmonary effort is normal. No respiratory distress.     Breath sounds: Normal breath sounds. No wheezing, rhonchi or rales.  Skin:    General: Skin is warm and dry.  Neurological:     Mental Status: She is alert.  Psychiatric:        Mood and Affect: Mood normal.        Thought Content: Thought content normal.     UC Treatments / Results  Labs (all labs ordered are listed, but only abnormal results are displayed) Labs Reviewed  RESPIRATORY PANEL BY PCR  SARS CORONAVIRUS 2 (TAT 6-24 HRS)    EKG   Radiology No results found.  Procedures Procedures (including critical care time)  Medications Ordered in UC Medications - No data to display  Initial Impression / Assessment and Plan / UC Course  I have reviewed the triage vital signs and the nursing notes.  Pertinent labs & imaging results that were available during my care of the patient were reviewed by me and considered in my medical decision making (see  chart for details).    Suspect viral etiology of symptoms.  Will order respiratory panel as well as COVID screening.  Encouraged symptomatic treatment, increase fluids and rest.  Will await results for further recommendation.  Final Clinical Impressions(s) / UC Diagnoses   Final diagnoses:  Upper respiratory tract infection, unspecified type   Discharge Instructions   None    ED Prescriptions   None    PDMP not reviewed this encounter.   Tomi Bamberger, PA-C 08/22/21 1138

## 2021-08-22 NOTE — ED Triage Notes (Signed)
Pt presents to the office for cough and fever x 2 days.  

## 2021-09-24 ENCOUNTER — Encounter (HOSPITAL_COMMUNITY): Payer: Self-pay

## 2021-09-24 ENCOUNTER — Ambulatory Visit (HOSPITAL_COMMUNITY)
Admission: EM | Admit: 2021-09-24 | Discharge: 2021-09-24 | Disposition: A | Discharge status: Home / Self Care | Payer: Medicaid Other | Attending: Internal Medicine | Admitting: Internal Medicine

## 2021-09-24 ENCOUNTER — Other Ambulatory Visit: Payer: Self-pay

## 2021-09-24 DIAGNOSIS — U071 COVID-19: Secondary | ICD-10-CM | POA: Insufficient documentation

## 2021-09-24 DIAGNOSIS — Z20822 Contact with and (suspected) exposure to covid-19: Secondary | ICD-10-CM | POA: Diagnosis not present

## 2021-09-24 DIAGNOSIS — J069 Acute upper respiratory infection, unspecified: Secondary | ICD-10-CM | POA: Insufficient documentation

## 2021-09-24 LAB — SARS CORONAVIRUS 2 (TAT 6-24 HRS): SARS Coronavirus 2: POSITIVE — AB

## 2021-09-24 NOTE — Discharge Instructions (Signed)
We have tested you for COVID and the results will likely come back tomorrow.   If you have difficulty breathing, chest pain, you are vomiting and can't keep any liquids down and you aren't urinating at least 50% of your normal amount, you should be seen at the emergency room right away.  If you aren't improving over the next week, please follow up with your regular medical provider. ° °For your congestion, you can use nasal saline spray.  You can also use a humidifier.  You can also use honey as needed for cough by the spoonful or in a warm liquid (do not give honey to an infant less than a year old).  °

## 2021-09-24 NOTE — ED Provider Notes (Signed)
[facility]    CSN: [id number] Arrival date & time: 09/24/21  0818      History   Chief Complaint Chief Complaint  Patient presents with   Cough    HPI Amanda Daniel is a 21 y.o. female.   Cough Started 1 day ago Temp to 102 overnight Endorses congestion, rhinorrhea, sore throat, fatigue, myalgias Denies nausea, vomiting, diarrhea, CP, SOB Has been drinking normally, has normal UOP  Had an exposure to COVID last week at work Had confirmed flu beginning of December Has not been tested for COVID     Past Medical History:  Diagnosis Date   Allergic rhinitis    Asthma    hasnt used rescue inhaler in months   Atopic dermatitis 2014   Chlamydia infection affecting pregnancy 06/17/2017   Was treated 06/15/17, partner also treated [x ] TOC > check at 20 wk visit> neg   Obesity 2013   Preeclampsia, severe, third trimester 11/09/2017   Pregnancy affected by fetal growth restriction 11/03/2017       Guidelines for Antenatal Testing and Sonography  (with updated ICD-10 codes) INDICATION U/S NST/AFI DELIVERY IUGR- O36.5990   EFW < 10% w/ AEDF & low AFV or EFW < 3%     EFW < 10%, Nml Dopplers & AFV, AC<3%, no other comorbidities  PRN  [id number]  Inpatient  24//BPP+dopplers wkly  PRN  PRN or 39    Supervision of high risk pregnancy, antepartum 05/20/2017    Clinic [facility] Prenatal Labs Dating 1st trimester u/s Blood type: O/Positive/-- (08/30 0859) O pos Genetic Screen 1 Screen:    AFP:     Quad:     NIPS: Antibody:Negative (08/30 0859)neg Anatomic [place]  normal except velamentous cord Rubella: 3.79 (08/30 0859)Immune GTT Early:               Third trimester:  RPR: Non Reactive (08/30 0859) NR Flu vaccine  06/17/17 HBsAg: Negative (08/30 0859) Neg TDaP    Supervision of normal first teen pregnancy 05/20/2017   Velamentous insertion of umbilical cord, 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   [place] for growth @25  wks > SGA at 11%    Patient Active Problem List   Diagnosis Date Noted    Obesity in pregnancy, antepartum 09/29/2017   Allergic rhinitis 03/27/2014   Atopic dermatitis 03/27/2014   BMI (body mass index), pediatric, > 99% for age 65/09/2013   Asthma, moderate persistent, well-controlled 12/20/2013   Failed hearing screening 12/20/2013   Acne 12/20/2013    Past Surgical History:  Procedure Laterality Date   TONSILLECTOMY      OB History     Gravida  1   Para  1   Term  1   Preterm      AB      Living  1      SAB      IAB      Ectopic      Multiple  0   Live Births  1            Home Medications    Prior to Admission medications   Medication Sig Start Date End Date Taking? Authorizing Provider  FLOVENT HFA 110 MCG/ACT inhaler Inhale 2 puffs into the lungs 2 (two) times daily. 08/01/19   13/10/20, MD  fluticasone (FLONASE) 50 MCG/ACT nasal spray Place 1 spray into both nostrils daily. 1 spray in each nostril every day 08/01/19   13/10/20, MD  montelukast (SINGULAIR) 10 MG tablet Take 1  tablet (10 mg total) by mouth at bedtime. 08/01/19   Theadore NanMcCormick, Hilary, MD  norgestimate-ethinyl estradiol (ORTHO-CYCLEN,SPRINTEC,PREVIFEM) 0.25-35 MG-MCG tablet Take 1 tablet by mouth daily. Patient not taking: Reported on 08/01/2019 01/25/18   Pincus LargePhelps, Jazma Y, DO  ondansetron (ZOFRAN ODT) 4 MG disintegrating tablet Take 1 tablet (4 mg total) by mouth every 8 (eight) hours as needed for nausea or vomiting. 01/12/21   Raspet, Erin K, PA-C  PROAIR HFA 108 (90 Base) MCG/ACT inhaler TAKE 2 PUFFS BY MOUTH EVERY 6 HOURS AS NEEDED FOR WHEEZE OR SHORTNESS OF BREATH 05/16/19   Theadore NanMcCormick, Hilary, MD  triamcinolone ointment (KENALOG) 0.1 % Apply 1 application topically 2 (two) times daily. 08/01/19   Theadore NanMcCormick, Hilary, MD    Family History History reviewed. No pertinent family history.  Social History Social History   Tobacco Use   Smoking status: Never   Smokeless tobacco: Never  Substance Use Topics   Alcohol use: No   Drug use: No      Allergies   Lactose intolerance (gi), Penicillins, Eggs or egg-derived products, Latex, and Peanut-containing drug products   Review of Systems Review of Systems  All other systems reviewed and are negative. Per HPI  Physical Exam Triage Vital Signs ED Triage Vitals  Enc Vitals Group     BP      Pulse      Resp      Temp      Temp src      SpO2      Weight      Height      Head Circumference      Peak Flow      Pain Score      Pain Loc      Pain Edu?      Excl. in GC?    No data found.  Updated Vital Signs BP (!) 150/94 (BP Location: Left Arm)    Pulse 96    Temp 98.7 F (37.1 C) (Oral)    Resp 18    LMP 09/11/2021    SpO2 98%   Visual Acuity Right Eye Distance:   Left Eye Distance:   Bilateral Distance:    Right Eye Near:   Left Eye Near:    Bilateral Near:     Physical Exam Constitutional:      General: She is not in acute distress.    Appearance: Normal appearance. She is not toxic-appearing.  HENT:     Head: Normocephalic and atraumatic.     Right Ear: Tympanic membrane normal.     Left Ear: Tympanic membrane normal.     Nose: Congestion and rhinorrhea present.     Mouth/Throat:     Mouth: Mucous membranes are moist.     Pharynx: Oropharynx is clear. No oropharyngeal exudate or posterior oropharyngeal erythema.  Eyes:     Conjunctiva/sclera: Conjunctivae normal.  Cardiovascular:     Rate and Rhythm: Normal rate and regular rhythm.     Heart sounds: No murmur heard.   No friction rub. No gallop.  Pulmonary:     Effort: Pulmonary effort is normal. No respiratory distress.     Breath sounds: No wheezing, rhonchi or rales.  Musculoskeletal:     Cervical back: Normal range of motion and neck supple.  Lymphadenopathy:     Cervical: Cervical adenopathy present.  Skin:    General: Skin is warm and dry.     Capillary Refill: Capillary refill takes less than 2 seconds.  Neurological:  General: No focal deficit present.     Mental Status:  She is alert and oriented to person, place, and time.     UC Treatments / Results  Labs (all labs ordered are listed, but only abnormal results are displayed) Labs Reviewed  SARS CORONAVIRUS 2 (TAT 6-24 HRS)    EKG   Radiology No results found.  Procedures Procedures (including critical care time)  Medications Ordered in UC Medications - No data to display  Initial Impression / Assessment and Plan / UC Course  I have reviewed the triage vital signs and the nursing notes.  Pertinent labs & imaging results that were available during my care of the patient were reviewed by me and considered in my medical decision making (see chart for details).     VSS.  COVID test performed.  POC Influenza test deferred given recent influenza. Advised of OTC treatments and ED precautions, see AVS.     Final Clinical Impressions(s) / UC Diagnoses   Final diagnoses:  Viral URI with cough     Discharge Instructions      We have tested you for COVID and the results will likely come back tomorrow.  If you have difficulty breathing, chest pain, you are vomiting and can't keep any liquids down and you aren't urinating at least 50% of your normal amount, you should be seen at the emergency room right away.  If you aren't improving over the next week, please follow up with your regular medical provider.  For your congestion, you can use nasal saline spray.  You can also use a humidifier.  You can also use honey as needed for cough by the spoonful or in a warm liquid (do not give honey to an infant less than a year old).      ED Prescriptions   None    PDMP not reviewed this encounter.   Breckin Savannah, Solmon Ice, DO 09/24/21 1032

## 2021-09-24 NOTE — ED Triage Notes (Signed)
Pt c/o cough, congestion, headache, body aches, fever, and ear pressure since yesterday.

## 2021-12-11 ENCOUNTER — Encounter (HOSPITAL_COMMUNITY): Payer: Self-pay

## 2021-12-11 ENCOUNTER — Other Ambulatory Visit: Payer: Self-pay

## 2021-12-11 ENCOUNTER — Ambulatory Visit (HOSPITAL_COMMUNITY)
Admission: RE | Admit: 2021-12-11 | Discharge: 2021-12-11 | Disposition: A | Discharge status: Home / Self Care | Payer: Medicaid Other | Source: Ambulatory Visit | Attending: Family Medicine | Admitting: Family Medicine

## 2021-12-11 VITALS — BP 144/80 | HR 73 | Temp 97.9°F | Resp 18

## 2021-12-11 DIAGNOSIS — R21 Rash and other nonspecific skin eruption: Secondary | ICD-10-CM

## 2021-12-11 MED ORDER — TRIAMCINOLONE ACETONIDE 0.1 % EX OINT: 1.0000 "application " | TOPICAL_OINTMENT | Freq: Two times a day (BID) | CUTANEOUS | 1 refills | Status: AC

## 2021-12-11 MED ORDER — KETOCONAZOLE 2 % EX CREA
1.0000 | TOPICAL_CREAM | Freq: Two times a day (BID) | CUTANEOUS | 0 refills | Status: DC
Start: 2021-12-11 — End: 2024-01-01

## 2021-12-11 NOTE — ED Provider Notes (Signed)
?[facility] ? ? ? ?CSN: [id number] ?Arrival date & time: 12/11/21  1352 ? ? ?  ? ?History   ?Chief Complaint ?Chief Complaint  ?Patient presents with  ? 2p appt  ? Rash  ? ? ?HPI ?Amanda Daniel is a 21 y.o. female.  ? ? ?Rash ?Here with a pruritic rash on her right posterior arm, that is been there for about a week.  She has been applying an over-the-counter antifungal cream, most likely terbinafine, and its not helping so far. ? ? ? ?Past Medical History:  ?Diagnosis Date  ? Allergic rhinitis   ? Asthma   ? hasnt used rescue inhaler in months  ? Atopic dermatitis 2014  ? Chlamydia infection affecting pregnancy 06/17/2017  ? Was treated 06/15/17, partner also treated [x ] TOC > check at 20 wk visit> neg  ? Obesity 2013  ? Preeclampsia, severe, third trimester 11/09/2017  ? Pregnancy affected by fetal growth restriction 11/03/2017  ?     Guidelines for Antenatal Testing and Sonography  (with updated ICD-10 codes) INDICATION U/S NST/AFI DELIVERY IUGR- O36.5990   EFW < 10% w/ AEDF & low AFV or EFW < 3%     EFW < 10%, Nml Dopplers & AFV, AC<3%, no other comorbidities  PRN  [id number]  Inpatient  24//BPP+dopplers wkly  PRN  PRN or 39   ? Supervision of high risk pregnancy, antepartum 05/20/2017  ?  [facility]inic [facility] Prenatal Labs Dating 1st trimester u/s Blood type: O/Positive/-- (08/30 0859) O pos Genetic Screen 1? Screen:    AFP:     Quad:     NIPS: Antibody:Negative (08/30 0859)neg Anatomic [place]  normal except velamentous cord Rubella: 3.79 (08/30 0859)Immune GTT Early:               Third trimester:  RPR: Non Reactive (08/30 0859) NR Flu vaccine  06/17/17 HBsAg: Negative (08/30 0859) Neg TDaP   ? Supervision of normal first teen pregnancy 05/20/2017  ? 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 [place] for growth @25  wks > SGA at 11%  ? ? ?Patient Active Problem List  ? Diagnosis Date Noted  ? Obesity in pregnancy, antepartum 09/29/2017  ? Allergic rhinitis 03/27/2014  ? Atopic  dermatitis 03/27/2014  ? BMI (body mass index), pediatric, > 99% for age 64/09/2013  ? Asthma, moderate persistent, well-controlled 12/20/2013  ? Failed hearing screening 12/20/2013  ? Acne 12/20/2013  ? ? ?Past Surgical History:  ?Procedure Laterality Date  ? TONSILLECTOMY    ? ? ?OB History   ? ? Gravida  ?1  ? Para  ?1  ? Term  ?1  ? Preterm  ?   ? AB  ?   ? Living  ?1  ?  ? ? SAB  ?   ? IAB  ?   ? Ectopic  ?   ? Multiple  ?0  ? Live Births  ?1  ?   ?  ?  ? ? ? ?Home Medications   ? ?Prior to Admission medications   ?Medication Sig Start Date End Date Taking? Authorizing Provider  ?ketoconazole (NIZORAL) 2 % cream Apply 1 application. topically 2 (two) times daily. 12/11/21  Yes 12/13/21, MD  ?FLOVENT HFA 110 MCG/ACT inhaler Inhale 2 puffs into the lungs 2 (two) times daily. 08/01/19   13/10/20, MD  ?fluticasone (FLONASE) 50 MCG/ACT nasal spray Place 1 spray into both nostrils daily. 1 spray in each nostril every day 08/01/19  Theadore NanMcCormick, Hilary, MD  ?montelukast (SINGULAIR) 10 MG tablet Take 1 tablet (10 mg total) by mouth at bedtime. 08/01/19   Theadore NanMcCormick, Hilary, MD  ?norgestimate-ethinyl estradiol (ORTHO-CYCLEN,SPRINTEC,PREVIFEM) 0.25-35 MG-MCG tablet Take 1 tablet by mouth daily. ?Patient not taking: Reported on 08/01/2019 01/25/18   Pincus LargePhelps, Jazma Y, DO  ?ondansetron (ZOFRAN ODT) 4 MG disintegrating tablet Take 1 tablet (4 mg total) by mouth every 8 (eight) hours as needed for nausea or vomiting. 01/12/21   Raspet, Noberto RetortErin K, PA-C  ?PROAIR HFA 108 (90 Base) MCG/ACT inhaler TAKE 2 PUFFS BY MOUTH EVERY 6 HOURS AS NEEDED FOR WHEEZE OR SHORTNESS OF BREATH 05/16/19   Theadore NanMcCormick, Hilary, MD  ?triamcinolone ointment (KENALOG) 0.1 % Apply 1 application. topically 2 (two) times daily. 12/11/21   Zenia ResidesBanister, Veronnica Hennings K, MD  ? ? ?Family History ?Family History  ?Problem Relation Age of Onset  ? Healthy Mother   ? Healthy Father   ? ? ?Social History ?Social History  ? ?Tobacco Use  ? Smoking status: Never  ?  Smokeless tobacco: Never  ?Substance Use Topics  ? Alcohol use: No  ? Drug use: No  ? ? ? ?Allergies   ?Lactose intolerance (gi), Penicillins, Eggs or egg-derived products, Latex, and Peanut-containing drug products ? ? ?Review of Systems ?Review of Systems  ?Skin:  Positive for rash.  ? ? ?Physical Exam ?Triage Vital Signs ?ED Triage Vitals  ?Enc Vitals Group  ?   BP 12/11/21 1416 (!) 144/80  ?   Pulse Rate 12/11/21 1416 73  ?   Resp 12/11/21 1416 18  ?   Temp 12/11/21 1416 97.9 ?F (36.6 ?C)  ?   Temp Source 12/11/21 1416 Oral  ?   SpO2 12/11/21 1416 98 %  ?   Weight --   ?   Height --   ?   Head Circumference --   ?   Peak Flow --   ?   Pain Score 12/11/21 1414 0  ?   Pain Loc --   ?   Pain Edu? --   ?   Excl. in GC? --   ? ?No data found. ? ?Updated Vital Signs ?BP (!) 144/80 (BP Location: Right Arm)   Pulse 73   Temp 97.9 ?F (36.6 ?C) (Oral)   Resp 18   SpO2 98%  ? ?Visual Acuity ?Right Eye Distance:   ?Left Eye Distance:   ?Bilateral Distance:   ? ?Right Eye Near:   ?Left Eye Near:    ?Bilateral Near:    ? ?Physical Exam ?Vitals reviewed.  ?Constitutional:   ?   General: She is not in acute distress. ?   Appearance: She is not toxic-appearing.  ?Skin: ?   Comments: There is a confluent circular rash with scaly edges on her right upper posterior arm, about 2.5 cm diameter  ?Neurological:  ?   General: No focal deficit present.  ?   Mental Status: She is alert and oriented to person, place, and time.  ? ? ? ?UC Treatments / Results  ?Labs ?(all labs ordered are listed, but only abnormal results are displayed) ?Labs Reviewed - No data to display ? ?EKG ? ? ?Radiology ?No results found. ? ?Procedures ?Procedures (including critical care time) ? ?Medications Ordered in UC ?Medications - No data to display ? ?Initial Impression / Assessment and Plan / UC Course  ?I have reviewed the triage vital signs and the nursing notes. ? ?Pertinent labs & imaging results that were available during my care  of the patient were  reviewed by me and considered in my medical decision making (see chart for details). ? ?  ? ?We will treat with ketoconazole and triamcinolone ?Final Clinical Impressions(s) / UC Diagnoses  ? ?Final diagnoses:  ?Rash  ? ? ? ?Discharge Instructions   ? ?  ?Apply triamcinolone twice daily to the rash.  This is a steroid type cream ? ?Apply ketoconazole twice daily to the rash, alternating with the above cream.  This is an antifungal cream ? ? ? ? ?ED Prescriptions   ? ? Medication Sig Dispense Auth. Provider  ? triamcinolone ointment (KENALOG) 0.1 % Apply 1 application. topically 2 (two) times daily. 30 g Zenia Resides, MD  ? ketoconazole (NIZORAL) 2 % cream Apply 1 application. topically 2 (two) times daily. 15 g Zenia Resides, MD  ? ?  ? ?PDMP not reviewed this encounter. ?  ?Zenia Resides, MD ?12/11/21 1448 ? ?

## 2021-12-11 NOTE — ED Triage Notes (Signed)
Approx 1 week h/o possible ringworm on right arm. Pt has been using an otc cream without relief noting the area continues to itch. No other rashes noted. ?

## 2021-12-11 NOTE — Discharge Instructions (Addendum)
Apply triamcinolone twice daily to the rash.  This is a steroid type cream ? ?Apply ketoconazole twice daily to the rash, alternating with the above cream.  This is an antifungal cream ?

## 2023-11-02 ENCOUNTER — Ambulatory Visit (HOSPITAL_COMMUNITY)
Admission: EM | Admit: 2023-11-02 | Discharge: 2023-11-02 | Disposition: A | Discharge status: Home / Self Care | Payer: Medicaid Other | Attending: Family Medicine | Admitting: Family Medicine

## 2023-11-02 ENCOUNTER — Encounter (HOSPITAL_COMMUNITY): Payer: Self-pay

## 2023-11-02 DIAGNOSIS — M79601 Pain in right arm: Secondary | ICD-10-CM

## 2023-11-02 MED ORDER — NAPROXEN 500 MG PO TABS
500.0000 mg | ORAL_TABLET | Freq: Two times a day (BID) | ORAL | 0 refills | Status: DC
Start: 2023-11-02 — End: 2024-01-01

## 2023-11-02 NOTE — Discharge Instructions (Signed)
You were seen today for right arm/wrist pain.  This could be carpal tunnel or tendonitis.  I have given you a wrist splint to use during work and sleep.  I have sent out an anti-inflammatory to use twice/day.   If you continue to have symptoms despite treatment, please follow up with your primary care provider, or with an orthopedist by calling [phone].

## 2023-11-02 NOTE — ED Provider Notes (Signed)
[facility]    CSN: [id number] Arrival date & time: 11/02/23  1139      History   Chief Complaint Chief Complaint  Patient presents with   Arm Pain          HPI Amanda Daniel is a 23 y.o. female.    Arm Pain  Patient is here for intermittent right arm pain x 1 month.  The is pain is at the whole forearm.  No known injury.  Not affected by movement.  She can just be laying in bed and have pain.  No medications used.  She is having pain at this time.  Pain seems to shoot from the wrist up the forearm.  Does not include the elbow.  It will come and last for several days and then improved.  She works as [employer].        Past Medical History:  Diagnosis Date   Allergic rhinitis    Asthma    hasnt used rescue inhaler in months   Atopic dermatitis 2014   Chlamydia infection affecting pregnancy 06/17/2017   Was treated 06/15/17, partner also treated [x ] TOC > check at 20 wk visit> neg   Obesity 2013   Preeclampsia, severe, third trimester 11/09/2017   Pregnancy affected by fetal growth restriction 11/03/2017       Guidelines for Antenatal Testing and Sonography  (with updated ICD-10 codes) INDICATION U/S NST/AFI DELIVERY IUGR- O36.5990   EFW < 10% w/ AEDF & low AFV or EFW < 3%     EFW < 10%, Nml Dopplers & AFV, AC<3%, no other comorbidities  PRN  [id number]  Inpatient  23//BPP+dopplers wkly  PRN  PRN or 39    Supervision of high risk pregnancy, antepartum 05/20/2017    Clinic [facility] Prenatal Labs Dating 1st trimester u/s Blood type: O/Positive/-- (08/30 0859) O pos Genetic Screen 1 Screen:    AFP:     Quad:     NIPS: Antibody:Negative (08/30 0859)neg Anatomic [place]  normal except velamentous cord Rubella: 3.79 (08/30 0859)Immune GTT Early:               Third trimester:  RPR: Non Reactive (08/30 0859) NR Flu vaccine  06/17/17 HBsAg: Negative (08/30 0859) Neg TDaP    Supervision of normal first teen pregnancy 05/20/2017   Velamentous insertion of umbilical  cord, 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   [place] for growth @25  wks > SGA at 11%    Patient Active Problem List   Diagnosis Date Noted   Obesity in pregnancy, antepartum 09/29/2017   Allergic rhinitis 03/27/2014   Atopic dermatitis 03/27/2014   BMI (body mass index), pediatric, > 99% for age 38/09/2013   Asthma, moderate persistent, well-controlled 12/20/2013   Failed hearing screening 12/20/2013   Acne 12/20/2013    Past Surgical History:  Procedure Laterality Date   TONSILLECTOMY      OB History     Gravida  1   Para  1   Term  1   Preterm      AB      Living  1      SAB      IAB      Ectopic      Multiple  0   Live Births  1            Home Medications    Prior to Admission medications   Medication Sig Start Date End Date Taking? Authorizing Provider  FLOVENT HFA 110 MCG/ACT inhaler Inhale 2  puffs into the lungs 2 (two) times daily. 08/01/19   [name], MD  fluticasone (FLONASE) 50 MCG/ACT nasal spray Place 1 spray into both nostrils daily. 1 spray in each nostril every day 08/01/19   [name], MD  ketoconazole (NIZORAL) 2 % cream Apply 1 application. topically 2 (two) times daily. 12/11/21   Zenia Resides, MD  montelukast (SINGULAIR) 10 MG tablet Take 1 tablet (10 mg total) by mouth at bedtime. 08/01/19   [name], MD  norgestimate-ethinyl estradiol (ORTHO-CYCLEN,SPRINTEC,PREVIFEM) 0.25-35 MG-MCG tablet Take 1 tablet by mouth daily. Patient not taking: Reported on 08/01/2019 01/25/18   Pincus Large, DO  ondansetron (ZOFRAN ODT) 4 MG disintegrating tablet Take 1 tablet (4 mg total) by mouth every 8 (eight) hours as needed for nausea or vomiting. 01/12/21   Raspet, Rikayla Demmon K, PA-C  PROAIR HFA 108 (90 Base) MCG/ACT inhaler TAKE 2 PUFFS BY MOUTH EVERY 6 HOURS AS NEEDED FOR WHEEZE OR SHORTNESS OF BREATH 05/16/19   [name], MD  triamcinolone ointment (KENALOG) 0.1 % Apply 1 application. topically 2 (two) times daily. 12/11/21    Zenia Resides, MD    Family History Family History  Problem Relation Age of Onset   Healthy Mother    Healthy Father     Social History Social History   Tobacco Use   Smoking status: Never   Smokeless tobacco: Never  Substance Use Topics   Alcohol use: No   Drug use: No     Allergies   Lactose intolerance (gi), Penicillins, Egg-derived products, Latex, and Peanut-containing drug products   Review of Systems Review of Systems  Constitutional: Negative.   HENT: Negative.    Respiratory: Negative.    Cardiovascular: Negative.   Gastrointestinal: Negative.   Musculoskeletal:  Positive for myalgias.     Physical Exam Triage Vital Signs ED Triage Vitals [11/02/23 1231]  Encounter Vitals Group     BP 137/89     Systolic BP Percentile      Diastolic BP Percentile      Pulse Rate 91     Resp 18     Temp 98.2 F (36.8 C)     Temp Source Oral     SpO2 98 %     Weight      Height      Head Circumference      Peak Flow      Pain Score 7     Pain Loc      Pain Education      Exclude from Growth Chart    No data found.  Updated Vital Signs BP 137/89 (BP Location: Left Arm)   Pulse 91   Temp 98.2 F (36.8 C) (Oral)   Resp 18   LMP 10/05/2023 (Exact Date)   SpO2 98%   Visual Acuity Right Eye Distance:   Left Eye Distance:   Bilateral Distance:    Right Eye Near:   Left Eye Near:    Bilateral Near:     Physical Exam Constitutional:      Appearance: Normal appearance. She is normal weight.  Musculoskeletal:     Comments: No redness or swelling to the right forearm.  She has ttp to the right wrist, up the forearm, mostly the radial side.  No elbow tenderness;  full rom of the elbow and wrist;  Phalens and tinels does worsen the pain.   Skin:    General: Skin is warm.  Neurological:     General: No focal  deficit present.     Mental Status: She is alert.  Psychiatric:        Mood and Affect: Mood normal.      UC Treatments / Results   Labs (all labs ordered are listed, but only abnormal results are displayed) Labs Reviewed - No data to display  EKG   Radiology No results found.  Procedures Procedures (including critical care time)  Medications Ordered in UC Medications - No data to display  Initial Impression / Assessment and Plan / UC Course  I have reviewed the triage vital signs and the nursing notes.  Pertinent labs & imaging results that were available during my care of the patient were reviewed by me and considered in my medical decision making (see chart for details).   Final Clinical Impressions(s) / UC Diagnoses   Final diagnoses:  Right arm pain     Discharge Instructions      You were seen today for right arm/wrist pain.  This could be carpal tunnel or tendonitis.  I have given you a wrist splint to use during work and sleep.  I have sent out an anti-inflammatory to use twice/day.   If you continue to have symptoms despite treatment, please follow up with your primary care provider, or with an orthopedist by calling [phone].     ED Prescriptions     Medication Sig Dispense Auth. Provider   naproxen (NAPROSYN) 500 MG tablet Take 1 tablet (500 mg total) by mouth 2 (two) times daily. 30 tablet Jannifer Franklin, MD      PDMP not reviewed this encounter.   Jannifer Franklin, MD 11/02/23 1247

## 2023-11-02 NOTE — ED Triage Notes (Signed)
Pt c/o lower rt arm pain intermittent for over a month. Denies injury or taking meds.

## 2023-11-10 ENCOUNTER — Telehealth (INDEPENDENT_AMBULATORY_CARE_PROVIDER_SITE_OTHER): Payer: Self-pay | Admitting: Primary Care

## 2023-11-10 NOTE — Telephone Encounter (Signed)
Called pt because pt was scheduled for new pt atp but we will not be in office. Pt will need to reschedule. I called to reschedule that atp but pt did not answer.

## 2023-11-11 ENCOUNTER — Ambulatory Visit (INDEPENDENT_AMBULATORY_CARE_PROVIDER_SITE_OTHER): Payer: Self-pay | Admitting: Primary Care

## 2023-12-17 ENCOUNTER — Other Ambulatory Visit (HOSPITAL_COMMUNITY)
Admission: RE | Admit: 2023-12-17 | Discharge: 2023-12-17 | Disposition: A | Discharge status: Home / Self Care | Source: Ambulatory Visit | Attending: Primary Care | Admitting: Primary Care

## 2023-12-17 ENCOUNTER — Ambulatory Visit (INDEPENDENT_AMBULATORY_CARE_PROVIDER_SITE_OTHER): Payer: Self-pay | Admitting: Primary Care

## 2023-12-17 VITALS — BP 127/85 | HR 87 | Ht 60.0 in | Wt 227.0 lb

## 2023-12-17 DIAGNOSIS — Z113 Encounter for screening for infections with a predominantly sexual mode of transmission: Secondary | ICD-10-CM

## 2023-12-17 DIAGNOSIS — N921 Excessive and frequent menstruation with irregular cycle: Secondary | ICD-10-CM

## 2023-12-17 DIAGNOSIS — D1720 Benign lipomatous neoplasm of skin and subcutaneous tissue of unspecified limb: Secondary | ICD-10-CM | POA: Diagnosis not present

## 2023-12-17 DIAGNOSIS — Z1159 Encounter for screening for other viral diseases: Secondary | ICD-10-CM | POA: Diagnosis not present

## 2023-12-17 DIAGNOSIS — Z7689 Persons encountering health services in other specified circumstances: Secondary | ICD-10-CM | POA: Diagnosis not present

## 2023-12-17 NOTE — Progress Notes (Signed)
 New Patient Office Visit  Subjective    Patient ID: Amanda Daniel female  DOB: [birth year]  Age: 23 y.o. MRN: [id number]   CC:  Establish care  HPI     New Patient (Initial Visit)    Additional comments: Knot on right leg STD testing      Last edited by Ronette Deter, CMA on 12/17/2023  9:14 AM.      HPI  Ms.Amanda Daniel is a 23 year old Lesbian female who identifies as a female sexually active no contraception used.  She is establishing care and voices no concerns would like to be tested for STIs and a knot felt on her right leg. Current Outpatient Medications on File Prior to Visit  Medication Sig Dispense Refill   FLOVENT HFA 110 MCG/ACT inhaler Inhale 2 puffs into the lungs 2 (two) times daily. 1 Inhaler 11   fluticasone (FLONASE) 50 MCG/ACT nasal spray Place 1 spray into both nostrils daily. 1 spray in each nostril every day 16 g 5   montelukast (SINGULAIR) 10 MG tablet Take 1 tablet (10 mg total) by mouth at bedtime. 30 tablet 5   naproxen (NAPROSYN) 500 MG tablet Take 1 tablet (500 mg total) by mouth 2 (two) times daily. 30 tablet 0   ondansetron (ZOFRAN ODT) 4 MG disintegrating tablet Take 1 tablet (4 mg total) by mouth every 8 (eight) hours as needed for nausea or vomiting. 20 tablet 0   PROAIR HFA 108 (90 Base) MCG/ACT inhaler TAKE 2 PUFFS BY MOUTH EVERY 6 HOURS AS NEEDED FOR WHEEZE OR SHORTNESS OF BREATH 18 g 0   triamcinolone ointment (KENALOG) 0.1 % Apply 1 application. topically 2 (two) times daily. 30 g 1   ketoconazole (NIZORAL) 2 % cream Apply 1 application. topically 2 (two) times daily. (Patient not taking: Reported on 12/17/2023) 15 g 0   norgestimate-ethinyl estradiol (ORTHO-CYCLEN,SPRINTEC,PREVIFEM) 0.25-35 MG-MCG tablet Take 1 tablet by mouth daily. (Patient not taking: Reported on 12/17/2023) 1 Package 11   No current facility-administered medications on file prior to visit.     Allergies  Allergen Reactions   Lactose Intolerance (Gi)      unknown   Penicillins Other (See Comments)    Wheezing Has patient had a PCN reaction causing immediate rash, facial/tongue/throat swelling, SOB or lightheadedness with hypotension: No Has patient had a PCN reaction causing severe rash involving mucus membranes or skin necrosis: No Has patient had a PCN reaction that required hospitalization: No Has patient had a PCN reaction occurring within the last 10 years: No If all of the above answers are "NO", then may proceed with Cephalosporin use.    Egg-Derived Products Rash   Latex Rash    unknown   Peanut-Containing Drug Products Rash    Past Medical History:  Diagnosis Date   Allergic rhinitis    Asthma    hasnt used rescue inhaler in months   Atopic dermatitis 2014   Chlamydia infection affecting pregnancy 06/17/2017   Was treated 06/15/17, partner also treated [x ] TOC > check at 20 wk visit> neg   Obesity 2013   Preeclampsia, severe, third trimester 11/09/2017   Pregnancy affected by fetal growth restriction 11/03/2017       Guidelines for Antenatal Testing and Sonography  (with updated ICD-10 codes) INDICATION U/S NST/AFI DELIVERY IUGR- O36.5990   EFW < 10% w/ AEDF & low AFV or EFW < 3%     EFW < 10%, Nml Dopplers & AFV, AC<3%, no other comorbidities  PRN  [id number]  Inpatient  23//BPP+dopplers wkly  PRN  PRN or 39    Supervision of high risk pregnancy, antepartum 05/20/2017    Clinic [facility] Prenatal Labs Dating 1st trimester u/s Blood type: O/Positive/-- (08/30 0859) O pos Genetic Screen 1 Screen:    AFP:     Quad:     NIPS: Antibody:Negative (08/30 0859)neg Anatomic [place]  normal except velamentous cord Rubella: 3.79 (08/30 0859)Immune GTT Early:               Third trimester:  RPR: Non Reactive (08/30 0859) NR Flu vaccine  06/17/17 HBsAg: Negative (08/30 0859) Neg TDaP    Supervision of normal first teen pregnancy 05/20/2017   Velamentous insertion of umbilical cord, 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   [place] for growth @25  wks > SGA at 11%      Past Surgical History:  Procedure Laterality Date   TONSILLECTOMY       Family History  Problem Relation Age of Onset   Healthy Mother    Healthy Father     Social History   Socioeconomic History   Marital status: Single    Spouse name: Not on file   Number of children: Not on file   Years of education: Not on file   Highest education level: 12th grade  Occupational History   Not on file  Tobacco Use   Smoking status: Never   Smokeless tobacco: Never  Substance and Sexual Activity   Alcohol use: No   Drug use: No   Sexual activity: Yes    Birth control/protection: None  Other Topics Concern   Not on file  Social History Narrative   Moved to [place] from [place] in 2012. Dad smokes outside   Social Drivers of Health   Financial Resource Strain: Low Risk  (12/17/2023)   Overall Financial Resource Strain (CARDIA)    Difficulty of Paying Living Expenses: Not hard at all  Food Insecurity: No Food Insecurity (12/17/2023)   Hunger Vital Sign    Worried About Running Out of Food in the Last Year: Never true    Ran Out of Food in the Last Year: Never true  Transportation Needs: No Transportation Needs (12/17/2023)   PRAPARE - Administrator, Civil Service (Medical): No    Lack of Transportation (Non-Medical): No  Physical Activity: Sufficiently Active (12/17/2023)   Exercise Vital Sign    Days of Exercise per Week: 7 days    Minutes of Exercise per Session: 150+ min  Stress: No Stress Concern Present (12/17/2023)   [employer] of Occupational Health - Occupational Stress Questionnaire    Feeling of Stress : Not at all  Social Connections: Moderately Isolated (12/17/2023)   Social Connection and Isolation Panel [NHANES]    Frequency of Communication with Friends and Family: More than three times a week    Frequency of Social Gatherings with Friends and Family: More than three times a week    Attends Religious Services: 1 to 4 times per year     Active Member of [employer] or Organizations: No    Attends Banker Meetings: Not on file    Marital Status: Never married  Intimate Partner Violence: Not At Risk (12/17/2023)   Humiliation, Afraid, Rape, and Kick questionnaire    Fear of Current or Ex-Partner: No    Emotionally Abused: No    Physically Abused: No    Sexually Abused: No    SDOH Interventions Today    Flowsheet Row Most Recent Value  SDOH Interventions   Utilities Interventions Intervention Not Indicated  Health Literacy Interventions Intervention Not Indicated        Health Maintenance  Topic Date Due   Hepatitis C Screening  Never done   Pap Smear  Never done   Chlamydia screening  03/10/2022   COVID-19 Vaccine (3 - 2024-25 season) 05/23/2023   Flu Shot  12/20/2023*   Pneumococcal Vaccination (1 of 1 - PPSV23 or PCV20) 12/16/2024*   DTaP/Tdap/Td vaccine (9 - Td or Tdap) 10/07/2027   HPV Vaccine  Completed   HIV Screening  Completed  *Topic was postponed. The date shown is not the original due date.    Objective    BP 127/85   Pulse 87   Ht 5' (1.524 m)   Wt 227 lb (103 kg)   LMP 12/06/2023   SpO2 100%   BMI 44.33 kg/m  Physical Exam Vitals reviewed.  Constitutional:      Appearance: Normal appearance. She is obese.     Comments: morbid  HENT:     Head: Normocephalic.     Right Ear: Tympanic membrane, ear canal and external ear normal.     Left Ear: Tympanic membrane, ear canal and external ear normal.     Nose: Nose normal.     Mouth/Throat:     Mouth: Mucous membranes are moist.  Eyes:     Extraocular Movements: Extraocular movements intact.     Pupils: Pupils are equal, round, and reactive to light.  Cardiovascular:     Rate and Rhythm: Normal rate.  Pulmonary:     Effort: Pulmonary effort is normal.     Breath sounds: Normal breath sounds.  Abdominal:     General: Bowel sounds are normal.     Palpations: Abdomen is soft.  Musculoskeletal:        General: Normal range of  motion.     Cervical back: Normal range of motion.  Skin:    General: Skin is warm and dry.     Comments: RLL very small lipoma   Neurological:     Mental Status: She is alert and oriented to person, place, and time.  Psychiatric:        Mood and Affect: Mood normal.        Behavior: Behavior normal.        Thought Content: Thought content normal.    Assessment & Plan:  Amanda Daniel was seen today for new patient (initial visit).  Diagnoses and all orders for this visit:  Encounter to establish care  Screening for STD (sexually transmitted disease) -     Cervicovaginal ancillary only -     HIV antibody (with reflex)  Lipoma of lower leg See HPI   Menorrhagia with irregular cycle -     CBC with Differential        The above assessment and management plan was discussed with the patient. The patient verbalized understanding of and has agreed to the management plan. Patient is aware to call the clinic if symptoms fail to improve or worsen. Patient is aware when to return to the clinic for a follow-up visit. Patient educated on when it is appropriate to go to the emergency department.   Gwinda Passe, NP-C

## 2023-12-17 NOTE — Patient Instructions (Addendum)
 BMI for Adults Body mass index (BMI) is a number found using a person's weight and height. BMI can help tell how much of a person's weight is made up of fat. BMI does not measure body fat directly. It is used instead of tests that directly measure body fat, which can be difficult and expensive. What are BMI measurements used for? BMI is useful to: Find out if your weight puts you at higher risk for medical problems. Help recommend changes, such as in diet and exercise. This can help you reach a healthy weight. BMI screening can be done again to see if these changes are working. How is BMI calculated? Your height and weight are measured. The BMI is found from those numbers. This can be done with U.S. or metric measurements. Note that charts and online BMI calculators are available to help you find your BMI quickly and easily without doing these calculations. To calculate your BMI in U.S. measurements: Measure your weight in pounds (lb). Multiply the number of pounds by 703. So, for an adult who weighs 150 lb, multiply that number by 703: 150 x 703, which equals 105,450. Measure your height in inches. Then multiply that number by itself to get a measurement called "inches squared." So, for an adult who is 70 inches tall, the "inches squared" measurement is 70 inches x 70 inches, which equals 4,900 inches squared. Divide the total from step 2 (number of lb x 703) by the total from step 3 (inches squared): 105,450  4,900 = 21.5. This is your BMI. To calculate your BMI in metric measurements:  Measure your weight in kilograms (kg). For this example, the weight is 70 kg. Measure your height in meters (m). Then multiply that number by itself to get a measurement called "meters squared." So, for an adult who is 1.75 m tall, the "meters squared" measurement is 1.75 m x 1.75 m, which equals 3.1 meters squared. Divide the number of kilograms (your weight) by the meters squared number. In this example: 70   3.1 = 22.6. This is your BMI. What do the results mean? BMI charts are used to see if you are underweight, normal weight, overweight, or obese. The following guidelines will be used: Underweight: BMI less than 18.5. Normal weight: BMI between 18.5 and 24.9. Overweight: BMI between 25 and 29.9. Obese: BMI of 30 or above. BMI is a tool and cannot diagnose a condition. Talk with your health care provider about what your BMI means for you. Keep these notes in mind: Weight includes fat and muscle. Someone with a muscular build, such as an athlete, may have a BMI that is higher than 24.9. In cases like these, BMI is not a correct measure of body fat. If you have a BMI of 25 or higher, your provider may need to do more testing to find out if excess body fat is the cause. BMI is measured the same way for males and females. Females usually have more body fat than males of the same height and weight. Where to find more information For more information about BMI, including tools to quickly find your BMI, go to: Centers for Disease Control and Prevention: TonerPromos.no American Heart Association: heart.org National Heart, Lung, and Blood Institute: BuffaloDryCleaner.gl This information is not intended to replace advice given to you by your health care provider. Make sure you discuss any questions you have with your health care provider. Document Revised: 05/28/2022 Document Reviewed: 05/21/2022 Elsevier Patient Education  2024 Elsevier Inc.Lipoma  A lipoma is a noncancerous (benign) tumor that is made up of fat cells. This is a very common type of soft-tissue growth. Lipomas are usually found under the skin (subcutaneous). They may occur in any tissue of the body that contains fat. Common areas for lipomas to appear include the back, arms, shoulders, buttocks, and thighs. Lipomas grow slowly, and they are usually painless. Most lipomas do not cause problems and do not require treatment. What are the causes? The  cause of this condition is not known. What increases the risk? You are more likely to develop this condition if: You are 61-86 years old. You have a family history of lipomas. What are the signs or symptoms? A lipoma usually appears as a small, round bump under the skin. In most cases, the lump will: Feel soft or rubbery. Not cause pain or other symptoms. However, if a lipoma is located in an area where it pushes on nerves, it can become painful or cause other symptoms. How is this diagnosed? A lipoma can usually be diagnosed with a physical exam. You may also have tests to confirm the diagnosis and to rule out other conditions. Tests may include: Imaging tests, such as a CT scan or an MRI. Removal of a tissue sample to be looked at under a microscope (biopsy). How is this treated? Treatment for this condition depends on the size of the lipoma and whether it is causing any symptoms. For small lipomas that are not causing problems, no treatment is needed. If a lipoma is bigger or it causes problems, surgery may be done to remove the lipoma. Lipomas can also be removed to improve appearance. Most often, the procedure is done after applying a medicine that numbs the area (local anesthetic). Liposuction may be done to reduce the size of the lipoma before it is removed through surgery, or it may be done to remove the lipoma. Lipomas are removed with this method to limit incision size and scarring. A liposuction tube is inserted through a small incision into the lipoma, and the contents of the lipoma are removed through the tube with suction. Follow these instructions at home: Watch your lipoma for any changes. Keep all follow-up visits. This is important. Where to find more information OrthoInfo: orthoinfo.aaos.org Contact a health care provider if: Your lipoma becomes larger or hard. Your lipoma becomes painful, red, or increasingly swollen. These could be signs of infection or a more serious  condition. Get help right away if: You develop tingling or numbness in an area near the lipoma. This could indicate that the lipoma is causing nerve damage. Summary A lipoma is a noncancerous tumor that is made up of fat cells. Most lipomas do not cause problems and do not require treatment. If a lipoma is bigger or it causes problems, surgery may be done to remove the lipoma. Contact a health care provider if your lipoma becomes larger or hard, or if it becomes painful, red, or increasingly swollen. These could be signs of infection or a more serious condition. This information is not intended to replace advice given to you by your health care provider. Make sure you discuss any questions you have with your health care provider. Document Revised: 09/26/2021 Document Reviewed: 09/26/2021 Elsevier Patient Education  2024 [employer].

## 2023-12-18 LAB — CBC WITH DIFFERENTIAL/PLATELET
Basophils Absolute: 0 10*3/uL (ref 0.0–0.2)
Basos: 1 %
EOS (ABSOLUTE): 0.1 10*3/uL (ref 0.0–0.4)
Eos: 1 %
Hematocrit: 37.4 % (ref 34.0–46.6)
Hemoglobin: 11.4 g/dL (ref 11.1–15.9)
Immature Grans (Abs): 0 10*3/uL (ref 0.0–0.1)
Immature Granulocytes: 0 %
Lymphocytes Absolute: 2.2 10*3/uL (ref 0.7–3.1)
Lymphs: 37 %
MCH: 25.3 pg — ABNORMAL LOW (ref 26.6–33.0)
MCHC: 30.5 g/dL — ABNORMAL LOW (ref 31.5–35.7)
MCV: 83 fL (ref 79–97)
Monocytes Absolute: 0.5 10*3/uL (ref 0.1–0.9)
Monocytes: 9 %
Neutrophils Absolute: 3.1 10*3/uL (ref 1.4–7.0)
Neutrophils: 52 %
Platelets: 372 10*3/uL (ref 150–450)
RBC: 4.51 x10E6/uL (ref 3.77–5.28)
RDW: 16.2 % — ABNORMAL HIGH (ref 11.7–15.4)
WBC: 5.9 10*3/uL (ref 3.4–10.8)

## 2023-12-18 LAB — HCV INTERPRETATION

## 2023-12-18 LAB — HCV AB W REFLEX TO QUANT PCR: HCV Ab: NONREACTIVE

## 2023-12-18 LAB — HIV ANTIBODY (ROUTINE TESTING W REFLEX): HIV Screen 4th Generation wRfx: NONREACTIVE

## 2023-12-20 LAB — CERVICOVAGINAL ANCILLARY ONLY
Bacterial Vaginitis (gardnerella): POSITIVE — AB
Candida Glabrata: NEGATIVE
Candida Vaginitis: NEGATIVE
Chlamydia: NEGATIVE
Comment: NEGATIVE
Comment: NEGATIVE
Comment: NEGATIVE
Comment: NEGATIVE
Comment: NEGATIVE
Comment: NORMAL
Neisseria Gonorrhea: NEGATIVE
Trichomonas: POSITIVE — AB

## 2023-12-21 ENCOUNTER — Encounter (INDEPENDENT_AMBULATORY_CARE_PROVIDER_SITE_OTHER): Payer: Self-pay | Admitting: Primary Care

## 2023-12-22 ENCOUNTER — Telehealth (INDEPENDENT_AMBULATORY_CARE_PROVIDER_SITE_OTHER): Payer: Self-pay

## 2023-12-22 ENCOUNTER — Encounter (INDEPENDENT_AMBULATORY_CARE_PROVIDER_SITE_OTHER): Payer: Self-pay | Admitting: Primary Care

## 2023-12-22 ENCOUNTER — Other Ambulatory Visit (INDEPENDENT_AMBULATORY_CARE_PROVIDER_SITE_OTHER): Payer: Self-pay | Admitting: Primary Care

## 2023-12-22 DIAGNOSIS — A599 Trichomoniasis, unspecified: Secondary | ICD-10-CM

## 2023-12-22 MED ORDER — METRONIDAZOLE 500 MG PO TABS: 500.0000 mg | ORAL_TABLET | Freq: Two times a day (BID) | ORAL | 0 refills | Status: DC

## 2023-12-22 NOTE — Telephone Encounter (Signed)
 Copied from CRM [id number]. Topic: Clinical - Lab/Test Results >> Dec 20, 2023  3:33 PM Eunice Blase wrote: Reason for CRM: Pt calling to discuss abnormal lab results, please call pt at [phone].     Called patient unable to make contact or leave voicemail, information

## 2024-01-01 ENCOUNTER — Ambulatory Visit (HOSPITAL_COMMUNITY)
Admission: EM | Admit: 2024-01-01 | Discharge: 2024-01-01 | Disposition: A | Discharge status: Home / Self Care | Attending: Emergency Medicine | Admitting: Emergency Medicine

## 2024-01-01 ENCOUNTER — Encounter (INDEPENDENT_AMBULATORY_CARE_PROVIDER_SITE_OTHER): Payer: Self-pay | Admitting: Primary Care

## 2024-01-01 ENCOUNTER — Encounter (HOSPITAL_COMMUNITY): Payer: Self-pay

## 2024-01-01 DIAGNOSIS — N946 Dysmenorrhea, unspecified: Secondary | ICD-10-CM

## 2024-01-01 LAB — POCT URINALYSIS DIP (MANUAL ENTRY)
Bilirubin, UA: NEGATIVE
Glucose, UA: NEGATIVE mg/dL
Ketones, POC UA: NEGATIVE mg/dL
Nitrite, UA: NEGATIVE
Protein Ur, POC: NEGATIVE mg/dL
Spec Grav, UA: 1.025 (ref 1.010–1.025)
Urobilinogen, UA: 0.2 U/dL
pH, UA: 6 (ref 5.0–8.0)

## 2024-01-01 LAB — POCT URINE PREGNANCY: Preg Test, Ur: NEGATIVE

## 2024-01-01 MED ORDER — KETOROLAC TROMETHAMINE 10 MG PO TABS: 10.0000 mg | ORAL_TABLET | Freq: Four times a day (QID) | ORAL | 0 refills | Status: DC | PRN

## 2024-01-01 MED ORDER — KETOROLAC TROMETHAMINE 30 MG/ML IJ SOLN
INTRAMUSCULAR | Status: AC
  Filled 2024-01-01: qty 1

## 2024-01-01 MED ORDER — KETOROLAC TROMETHAMINE 30 MG/ML IJ SOLN
30.0000 mg | Freq: Once | INTRAMUSCULAR | Status: AC
  Administered 2024-01-01: 30 mg via INTRAMUSCULAR

## 2024-01-01 NOTE — ED Provider Notes (Signed)
 [facility]    CSN: [id number] Arrival date & time: 01/01/24  1014      History   Chief Complaint Chief Complaint  Patient presents with   Abdominal Pain    HPI Amanda Daniel is a 23 y.o. female.   23 year old female who presents urgent care with complaints of suprapubic abdominal pain.  This started yesterday with the onset of her menses.  She normally has severe abdominal pain on the first day of her menses but this has lasted into the second day.  She has had some nausea but no vomiting.  She denies dysuria, fevers, chills, diarrhea, history of abdominal surgeries, history of abnormal Pap smear or other constitutional symptoms.  She has no previous problems with her uterus or ovaries.  She did just finish antibiotics for trichomonas and bacterial vaginitis but has no vaginal symptoms at this time.  Her flow is normal for her at this time.  She has taken ibuprofen and Midol but without relief.  She had to call out of work this morning and needs a note.     Abdominal Pain Associated symptoms: no chest pain, no chills, no cough, no dysuria, no fever, no hematuria, no shortness of breath, no sore throat and no vomiting     Past Medical History:  Diagnosis Date   Allergic rhinitis    Asthma    hasnt used rescue inhaler in months   Atopic dermatitis 2014   Chlamydia infection affecting pregnancy 06/17/2017   Was treated 06/15/17, partner also treated [x ] TOC > check at 20 wk visit> neg   Obesity 2013   Preeclampsia, severe, third trimester 11/09/2017   Pregnancy affected by fetal growth restriction 11/03/2017       Guidelines for Antenatal Testing and Sonography  (with updated ICD-10 codes) INDICATION U/S NST/AFI DELIVERY IUGR- O36.5990   EFW < 10% w/ AEDF & low AFV or EFW < 3%     EFW < 10%, Nml Dopplers & AFV, AC<3%, no other comorbidities  PRN  [id number]  Inpatient  24//BPP+dopplers wkly  PRN  PRN or 39    Supervision of high risk pregnancy, antepartum  05/20/2017    Clinic [facility] Prenatal Labs Dating 1st trimester u/s Blood type: O/Positive/-- (08/30 0859) O pos Genetic Screen 1 Screen:    AFP:     Quad:     NIPS: Antibody:Negative (08/30 0859)neg Anatomic [place]  normal except velamentous cord Rubella: 3.79 (08/30 0859)Immune GTT Early:               Third trimester:  RPR: Non Reactive (08/30 0859) NR Flu vaccine  06/17/17 HBsAg: Negative (08/30 0859) Neg TDaP    Supervision of normal first teen pregnancy 05/20/2017   Velamentous insertion of umbilical cord, 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   [place] for growth @25  wks > SGA at 11%    Patient Active Problem List   Diagnosis Date Noted   Obesity in pregnancy, antepartum 09/29/2017   Allergic rhinitis 03/27/2014   Atopic dermatitis 03/27/2014   BMI (body mass index), pediatric, > 99% for age 84/09/2013   Asthma, moderate persistent, well-controlled 12/20/2013   Failed hearing screening 12/20/2013   Acne 12/20/2013    Past Surgical History:  Procedure Laterality Date   TONSILLECTOMY      OB History     Gravida  1   Para  1   Term  1   Preterm      AB      Living  1  SAB      IAB      Ectopic      Multiple  0   Live Births  1            Home Medications    Prior to Admission medications   Medication Sig Start Date End Date Taking? Authorizing Provider  FLOVENT HFA 110 MCG/ACT inhaler Inhale 2 puffs into the lungs 2 (two) times daily. 08/01/19  Yes Lavonda Pour, MD  fluticasone (FLONASE) 50 MCG/ACT nasal spray Place 1 spray into both nostrils daily. 1 spray in each nostril every day 08/01/19  Yes Lavonda Pour, MD  montelukast (SINGULAIR) 10 MG tablet Take 1 tablet (10 mg total) by mouth at bedtime. 08/01/19  Yes Lavonda Pour, MD  naproxen (NAPROSYN) 500 MG tablet Take 1 tablet (500 mg total) by mouth 2 (two) times daily. 11/02/23  Yes Piontek, [place] Dales, MD  PROAIR HFA 108 [phone] Base) MCG/ACT inhaler TAKE 2 PUFFS BY MOUTH EVERY 6 HOURS AS NEEDED FOR WHEEZE OR  SHORTNESS OF BREATH 05/16/19  Yes Lavonda Pour, MD  triamcinolone ointment (KENALOG) 0.1 % Apply 1 application. topically 2 (two) times daily. 12/11/21  Yes Ann Keto, MD    Family History Family History  Problem Relation Age of Onset   Healthy Mother    Healthy Father     Social History Social History   Tobacco Use   Smoking status: Never   Smokeless tobacco: Never  Substance Use Topics   Alcohol use: No   Drug use: No     Allergies   Lactose intolerance (gi), Penicillins, Egg-derived products, Latex, and Peanut-containing drug products   Review of Systems Review of Systems  Constitutional:  Negative for chills and fever.  HENT:  Negative for ear pain and sore throat.   Eyes:  Negative for pain and visual disturbance.  Respiratory:  Negative for cough and shortness of breath.   Cardiovascular:  Negative for chest pain and palpitations.  Gastrointestinal:  Positive for abdominal pain (cramps). Negative for vomiting.  Genitourinary:  Negative for dysuria and hematuria.  Musculoskeletal:  Negative for arthralgias and back pain.  Skin:  Negative for color change and rash.  Neurological:  Negative for seizures and syncope.  All other systems reviewed and are negative.    Physical Exam Triage Vital Signs ED Triage Vitals  Encounter Vitals Group     BP 01/01/24 1033 (!) 139/91     Systolic BP Percentile --      Diastolic BP Percentile --      Pulse Rate 01/01/24 1033 82     Resp 01/01/24 1033 18     Temp 01/01/24 1033 97.9 F (36.6 C)     Temp Source 01/01/24 1033 Oral     SpO2 01/01/24 1033 98 %     Weight --      Height --      Head Circumference --      Peak Flow --      Pain Score 01/01/24 1032 8     Pain Loc --      Pain Education --      Exclude from Growth Chart --    No data found.  Updated Vital Signs BP (!) 139/91 (BP Location: Left Arm)   Pulse 82   Temp 97.9 F (36.6 C) (Oral)   Resp 18   LMP 12/31/2023 (Exact Date)   SpO2  98%   Visual Acuity Right Eye Distance:   Left Eye Distance:  Bilateral Distance:    Right Eye Near:   Left Eye Near:    Bilateral Near:     Physical Exam Vitals and nursing note reviewed.  Constitutional:      General: She is not in acute distress.    Appearance: She is well-developed.  HENT:     Head: Normocephalic and atraumatic.  Eyes:     Conjunctiva/sclera: Conjunctivae normal.  Cardiovascular:     Rate and Rhythm: Normal rate and regular rhythm.     Heart sounds: No murmur heard. Pulmonary:     Effort: Pulmonary effort is normal. No respiratory distress.     Breath sounds: Normal breath sounds.  Abdominal:     General: Bowel sounds are normal.     Palpations: Abdomen is soft.     Tenderness: There is abdominal tenderness in the suprapubic area. There is no guarding or rebound. Negative signs include Murphy's sign and McBurney's sign.  Musculoskeletal:        General: No swelling.     Cervical back: Neck supple.  Skin:    General: Skin is warm and dry.     Capillary Refill: Capillary refill takes less than 2 seconds.  Neurological:     Mental Status: She is alert.  Psychiatric:        Mood and Affect: Mood normal.      UC Treatments / Results  Labs (all labs ordered are listed, but only abnormal results are displayed) Labs Reviewed  POCT URINALYSIS DIP (MANUAL ENTRY) - Abnormal; Notable for the following components:      Result Value   Blood, UA large (*)    Leukocytes, UA Trace (*)    All other components within normal limits  POCT URINE PREGNANCY    EKG   Radiology No results found.  Procedures Procedures (including critical care time)  Medications Ordered in UC Medications - No data to display  Initial Impression / Assessment and Plan / UC Course  I have reviewed the triage vital signs and the nursing notes.  Pertinent labs & imaging results that were available during my care of the patient were reviewed by me and considered in my  medical decision making (see chart for details).     Dysmenorrhea   Pregnancy test and urinalysis done today are negative.  Symptoms are most consistent with dysmenorrhea or painful menses. Toradol injection given today. This is a medication to help with pain. This is not a narcotic.  Toradol 10 mg by mouth every 6 hours as needed for pain. May take this at 5:00 pm tonight. Do not take ibuprofen while taking this medication. Ok to use tylenol. Rest and Stay hydrated  May use a heating pad on the lower abdomen to help with symptoms.  Make sure to schedule a follow up appointment with your gynecologist Return to urgent care or PCP if symptoms worsen or fail to resolve.    Final Clinical Impressions(s) / UC Diagnoses   Final diagnoses:  None   Discharge Instructions   None    ED Prescriptions   None    PDMP not reviewed this encounter.   Kreg Pesa, [place] 01/01/24 1111

## 2024-01-01 NOTE — Discharge Instructions (Addendum)
 Pregnancy test and urinalysis done today are negative.  Symptoms are most consistent with dysmenorrhea or painful menses. Toradol injection given today. This is a medication to help with pain. This is not a narcotic.  Toradol 10 mg by mouth every 6 hours as needed for pain. May take this at 5:00 pm tonight. Do not take ibuprofen while taking this medication. Ok to use tylenol. Rest and Stay hydrated  May use a heating pad on the lower abdomen to help with symptoms.  Make sure to schedule a follow up appointment with your gynecologist Return to urgent care or PCP if symptoms worsen or fail to resolve.

## 2024-01-01 NOTE — ED Triage Notes (Signed)
 Chief Complaint: abdominal pain and nausea. Denies emesis or diarrhea. Patient states on her cycle but it is unbearable.  Sick exposure: No  Onset: yesterday   Prescriptions or OTC medications tried: Yes- Ibuprofen    with no relief  New foods, medications, or products: No  Recent Travel: No

## 2024-01-03 ENCOUNTER — Ambulatory Visit (INDEPENDENT_AMBULATORY_CARE_PROVIDER_SITE_OTHER): Payer: Self-pay | Admitting: Primary Care

## 2024-01-03 NOTE — Telephone Encounter (Signed)
 Will forward to provider

## 2024-01-04 MED ORDER — IBUPROFEN 600 MG PO TABS: 600.0000 mg | ORAL_TABLET | Freq: Three times a day (TID) | ORAL | 0 refills | Status: AC | PRN

## 2024-01-17 ENCOUNTER — Ambulatory Visit (INDEPENDENT_AMBULATORY_CARE_PROVIDER_SITE_OTHER): Payer: Self-pay | Admitting: Primary Care

## 2024-01-20 ENCOUNTER — Encounter (HOSPITAL_COMMUNITY): Payer: Self-pay | Admitting: Family Medicine

## 2024-01-20 ENCOUNTER — Ambulatory Visit (HOSPITAL_COMMUNITY)
Admission: EM | Admit: 2024-01-20 | Discharge: 2024-01-20 | Disposition: A | Discharge status: Home / Self Care | Attending: Family Medicine | Admitting: Family Medicine

## 2024-01-20 ENCOUNTER — Other Ambulatory Visit: Payer: Self-pay

## 2024-01-20 ENCOUNTER — Encounter (INDEPENDENT_AMBULATORY_CARE_PROVIDER_SITE_OTHER): Payer: Self-pay | Admitting: Primary Care

## 2024-01-20 DIAGNOSIS — Z202 Contact with and (suspected) exposure to infections with a predominantly sexual mode of transmission: Secondary | ICD-10-CM | POA: Insufficient documentation

## 2024-01-20 NOTE — Telephone Encounter (Signed)
 Copied from CRM [id number]. Topic: General - Other >> Jan 20, 2024  8:34 AM Elle L wrote: Reason for CRM: The patient is requesting to remove her positive test results from MyChart as she finished her prescription and should now test negative. The patient's call back number is [phone].

## 2024-01-20 NOTE — Telephone Encounter (Signed)
 Returned pt call and made aware that results are not able to be removed from chart. Pt states she understands and doesn't have any questions or concerns

## 2024-01-20 NOTE — Discharge Instructions (Signed)
 We will follow-up with you for your lab results If you need any medication sent in we will send them in for you It is safe to avoid sexual interaction until your partner is treated and we have lab results for yourself

## 2024-01-20 NOTE — ED Triage Notes (Signed)
 Pt states that her girlfriend got tested and got a positive STD test but she's unsure which one. She was positive for BV and Trichomoniasis in March 2025. She states that she took the medication but has been exposed again to "something". Pt states that she is having no sx's.

## 2024-01-20 NOTE — ED Provider Notes (Signed)
 [facility]    CSN: [id number] Arrival date & time: 01/20/24  0800      History   Chief Complaint Chief Complaint  Patient presents with   Exposure to STD    HPI Amanda Daniel is a 23 y.o. female.   The patient reports no symptoms but her sexual partner was tested for STDs and came back positive for one.  She is not sure which one it was but would like to be tested today.  She has been tested previously and has not had any new partners since then.  She denies any burning with urination, vaginal discharge, external lesions, rashes, fevers or chills.  The history is provided by the patient.  Exposure to STD Pertinent negatives include no abdominal pain and no headaches.    Past Medical History:  Diagnosis Date   Allergic rhinitis    Asthma    hasnt used rescue inhaler in months   Atopic dermatitis 2014   Chlamydia infection affecting pregnancy 06/17/2017   Was treated 06/15/17, partner also treated [x ] TOC > check at 20 wk visit> neg   Obesity 2013   Preeclampsia, severe, third trimester 11/09/2017   Pregnancy affected by fetal growth restriction 11/03/2017       Guidelines for Antenatal Testing and Sonography  (with updated ICD-10 codes) INDICATION U/S NST/AFI DELIVERY IUGR- O36.5990   EFW < 10% w/ AEDF & low AFV or EFW < 3%     EFW < 10%, Nml Dopplers & AFV, AC<3%, no other comorbidities  PRN  [id number]  Inpatient  24//BPP+dopplers wkly  PRN  PRN or 39    Supervision of high risk pregnancy, antepartum 05/20/2017    Clinic [facility] Prenatal Labs Dating 1st trimester u/s Blood type: O/Positive/-- (08/30 0859) O pos Genetic Screen 1 Screen:    AFP:     Quad:     NIPS: Antibody:Negative (08/30 0859)neg Anatomic US   normal except velamentous cord Rubella: 3.79 (08/30 0859)Immune GTT Early:               Third trimester:  RPR: Non Reactive (08/30 0859) NR Flu vaccine  06/17/17 HBsAg: Negative (08/30 0859) Neg TDaP    Supervision of normal first teen pregnancy  05/20/2017   Velamentous insertion of umbilical cord, 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   US  for growth @25  wks > SGA at 11%    Patient Active Problem List   Diagnosis Date Noted   Obesity in pregnancy, antepartum 09/29/2017   Allergic rhinitis 03/27/2014   Atopic dermatitis 03/27/2014   BMI (body mass index), pediatric, > 99% for age 30/09/2013   Asthma, moderate persistent, well-controlled 12/20/2013   Failed hearing screening 12/20/2013   Acne 12/20/2013    Past Surgical History:  Procedure Laterality Date   TONSILLECTOMY      OB History     Gravida  1   Para  1   Term  1   Preterm      AB      Living  1      SAB      IAB      Ectopic      Multiple  0   Live Births  1            Home Medications    Prior to Admission medications   Medication Sig Start Date End Date Taking? Authorizing Provider  FLOVENT  HFA 110 MCG/ACT inhaler Inhale 2 puffs into the lungs 2 (two) times daily. 08/01/19   Lavonda Pour,  MD  fluticasone  (FLONASE ) 50 MCG/ACT nasal spray Place 1 spray into both nostrils daily. 1 spray in each nostril every day 08/01/19   Lavonda Pour, MD  ibuprofen  (ADVIL ) 600 MG tablet Take 1 tablet (600 mg total) by mouth every 8 (eight) hours as needed. 01/04/24   Marius Siemens, NP  ketorolac  (TORADOL ) 10 MG tablet Take 1 tablet (10 mg total) by mouth every 6 (six) hours as needed. 01/01/24   White, Elizabeth A, PA-C  montelukast  (SINGULAIR ) 10 MG tablet Take 1 tablet (10 mg total) by mouth at bedtime. 08/01/19   Lavonda Pour, MD  PROAIR  HFA 108 (90 Base) MCG/ACT inhaler TAKE 2 PUFFS BY MOUTH EVERY 6 HOURS AS NEEDED FOR WHEEZE OR SHORTNESS OF BREATH 05/16/19   Lavonda Pour, MD  triamcinolone  ointment (KENALOG ) 0.1 % Apply 1 application. topically 2 (two) times daily. 12/11/21   Ann Keto, MD    Family History Family History  Problem Relation Age of Onset   Healthy Mother    Healthy Father     Social History Social History    Tobacco Use   Smoking status: Never   Smokeless tobacco: Never  Substance Use Topics   Alcohol use: No   Drug use: No     Allergies   Lactose intolerance (gi), Penicillins, Egg-derived products, Latex, and Peanut-containing drug products   Review of Systems Review of Systems  Constitutional:  Negative for appetite change, chills, fatigue and fever.  HENT:  Negative for sore throat and voice change.   Eyes:  Negative for discharge and redness.  Gastrointestinal:  Negative for abdominal pain.  Genitourinary:  Negative for dysuria, genital sores, hematuria, pelvic pain, urgency, vaginal discharge and vaginal pain.  Musculoskeletal:  Negative for back pain, joint swelling and myalgias.  Skin:  Negative for rash.  Neurological:  Negative for dizziness, light-headedness and headaches.     Physical Exam Triage Vital Signs ED Triage Vitals  Encounter Vitals Group     BP      Systolic BP Percentile      Diastolic BP Percentile      Pulse      Resp      Temp      Temp src      SpO2      Weight      Height      Head Circumference      Peak Flow      Pain Score      Pain Loc      Pain Education      Exclude from Growth Chart    No data found.  Updated Vital Signs BP 125/79 (BP Location: Left Arm)   Pulse 86   Temp 98.1 F (36.7 C) (Oral)   Resp 18   LMP 12/31/2023 (Exact Date)   SpO2 96%   Visual Acuity Right Eye Distance:   Left Eye Distance:   Bilateral Distance:    Right Eye Near:   Left Eye Near:    Bilateral Near:     Physical Exam Vitals reviewed.  Constitutional:      General: She is not in acute distress.    Appearance: Normal appearance. She is not ill-appearing, toxic-appearing or diaphoretic.  Eyes:     General: No scleral icterus.       Right eye: No discharge.        Left eye: No discharge.     Extraocular Movements: Extraocular movements intact.     Conjunctiva/sclera: Conjunctivae normal.  Pupils: Pupils are equal, round, and  reactive to light.  Pulmonary:     Effort: Pulmonary effort is normal.  Musculoskeletal:     Cervical back: Normal range of motion.  Skin:    General: Skin is warm and dry.  Neurological:     General: No focal deficit present.     Mental Status: She is alert.  Psychiatric:        Mood and Affect: Mood normal.        Behavior: Behavior normal.     UC Treatments / Results  Labs (all labs ordered are listed, but only abnormal results are displayed) Labs Reviewed  CERVICOVAGINAL ANCILLARY ONLY    EKG   Radiology No results found.  Procedures Procedures (including critical care time)  Medications Ordered in UC Medications - No data to display  Initial Impression / Assessment and Plan / UC Course  I have reviewed the triage vital signs and the nursing notes.  Pertinent labs & imaging results that were available during my care of the patient were reviewed by me and considered in my medical decision making (see chart for details).     Exposure to STD -The patient's sexual partner reports testing positive for an STD.  However, she is unaware of which one was positive. The patient has not had any symptoms but would like to be tested today - HIV, RPR initially ordered but the patient refused blood draw - GC chlamydia, trichomonas, BV pending - We discussed safe sex practices. - The patient voiced understanding and agreement with the plan.   Final Clinical Impressions(s) / UC Diagnoses   Final diagnoses:  STD exposure     Discharge Instructions      We will follow-up with you for your lab results If you need any medication sent in we will send them in for you It is safe to avoid sexual interaction until your partner is treated and we have lab results for yourself      ED Prescriptions   None    PDMP not reviewed this encounter.   Claybon Cuna, MD 01/21/24 [phone]

## 2024-01-21 LAB — CERVICOVAGINAL ANCILLARY ONLY
Bacterial Vaginitis (gardnerella): NEGATIVE
Chlamydia: NEGATIVE
Comment: NEGATIVE
Comment: NEGATIVE
Comment: NEGATIVE
Comment: NORMAL
Neisseria Gonorrhea: NEGATIVE
Trichomonas: POSITIVE — AB

## 2024-01-23 ENCOUNTER — Telehealth (HOSPITAL_COMMUNITY): Payer: Self-pay | Admitting: Emergency Medicine

## 2024-01-23 MED ORDER — METRONIDAZOLE 500 MG PO TABS
500.0000 mg | ORAL_TABLET | Freq: Two times a day (BID) | ORAL | 0 refills | Status: AC
Start: 2024-01-23 — End: 2024-01-30

## 2024-01-23 NOTE — Telephone Encounter (Signed)
 Called patient, verified full name and date of birth before providing results. Positive trichomonas. Treat with flagyl  BID x 7 days.  Advised importance of finishing full 7 days of medication, abstaining from intercourse for another week after completion of treatment, importance of not drinking alcohol while on this medicine.  Patient verbalized understanding, all questions answered

## 2024-01-26 ENCOUNTER — Encounter (HOSPITAL_COMMUNITY): Payer: Self-pay

## 2024-02-02 ENCOUNTER — Encounter (INDEPENDENT_AMBULATORY_CARE_PROVIDER_SITE_OTHER): Payer: Self-pay | Admitting: Primary Care

## 2024-02-02 ENCOUNTER — Ambulatory Visit (INDEPENDENT_AMBULATORY_CARE_PROVIDER_SITE_OTHER): Admitting: Primary Care

## 2024-02-02 ENCOUNTER — Other Ambulatory Visit (HOSPITAL_COMMUNITY)
Admission: RE | Admit: 2024-02-02 | Discharge: 2024-02-02 | Disposition: A | Discharge status: Home / Self Care | Source: Ambulatory Visit | Attending: Primary Care | Admitting: Primary Care

## 2024-02-02 VITALS — BP 112/77 | HR 63 | Resp 16 | Wt 221.0 lb

## 2024-02-02 DIAGNOSIS — Z124 Encounter for screening for malignant neoplasm of cervix: Secondary | ICD-10-CM

## 2024-02-02 DIAGNOSIS — Z113 Encounter for screening for infections with a predominantly sexual mode of transmission: Secondary | ICD-10-CM | POA: Insufficient documentation

## 2024-02-02 NOTE — Progress Notes (Signed)
  [facility]  WELL-WOMAN PHYSICAL [facility] PAP Patient name: Amanda Daniel MRN [id number]  Date of birth: 01/22/2001 Chief Complaint:   Gynecologic Exam  History of Present Illness:   Amanda Daniel is a 23 y.o. G30P1001 female being seen today for a routine well-woman exam. Female new female partner recently dx with Trichomonas. Treated with abt's.  CC:GYN The current method of family planning is none.  Patient's last menstrual period was 01/26/2024. Last pap 05-14-00. Results were: abnormal  Family h/o breast cancer: No Family h/o colorectal cancer: No  Health Maintenance  Topic Date Due   Pap Smear  Never done   COVID-19 Vaccine (3 - 2024-25 season) 05/23/2023   Pneumococcal Vaccination (1 of 1 - PPSV23) 12/16/2024*   Flu Shot  04/21/2024   Chlamydia screening  01/19/2025   DTaP/Tdap/Td vaccine (9 - Td or Tdap) 10/07/2027   HPV Vaccine  Completed   Hepatitis C Screening  Completed   HIV Screening  Completed   Meningitis B Vaccine  Aged Out  *Topic was postponed. The date shown is not the original due date.   Review of Systems:    Denies any headaches, blurred vision, fatigue, shortness of breath, chest pain, abdominal pain, abnormal vaginal discharge/itching/odor/irritation, problems with periods, bowel movements, urination, or intercourse unless otherwise stated above.  Pertinent History Reviewed:   Reviewed past medical,surgical, social and family history.  Reviewed problem list, medications and allergies.  Physical Assessment:   Vitals:   02/02/24 1010  BP: 112/77  Pulse: 63  Resp: 16  SpO2: 98%  Weight: 221 lb (100.2 kg)  Body mass index is 43.16 kg/m.        Physical Examination:  General appearance - well appearing, and in no distress Mental status - alert, oriented to person, place, and time Psych:  She has a normal mood and affect Skin - warm and dry, normal color, no suspicious lesions noted Chest - effort normal, all lung fields clear to  auscultation bilaterally Heart - normal rate and regular rhythm Neck:  midline trachea, no thyromegaly or nodules Breasts - breasts appear normal, no suspicious masses bilateral masses hormonal, no skin or nipple changes or axillary nodes Educated patient on proper self breast examination and had patient to demonstrate SBE. Abdomen - soft, nontender, nondistended, no masses or organomegaly Pelvic-VULVA: normal appearing vulva with no masses, tenderness or lesions  VAGINA: normal appearing vagina with normal color and discharge, no lesions   CERVIX: normal appearing cervix without discharge or lesions, no CMT UTERUS: uterus is felt to be normal size, shape, consistency and nontender  ADNEXA: No adnexal masses or tenderness noted. Extremities:  No swelling or varicosities noted  No results found for this or any previous visit (from the past 24 hours).   Assessment & Plan:  Amanda Daniel was seen today for gynecologic exam.  Diagnoses and all orders for this visit:  Cervical cancer screening -     Cytology - PAP  Screening for STD (sexually transmitted disease) -     Cervicovaginal ancillary only    Follow-up: Return for annual physical.  This note has been created with Education officer, environmental. Any transcriptional errors are unintentional.   Marius Siemens, NP 02/02/2024, 11:17 AM

## 2024-02-03 ENCOUNTER — Other Ambulatory Visit (INDEPENDENT_AMBULATORY_CARE_PROVIDER_SITE_OTHER): Payer: Self-pay | Admitting: Primary Care

## 2024-02-03 ENCOUNTER — Ambulatory Visit (INDEPENDENT_AMBULATORY_CARE_PROVIDER_SITE_OTHER): Payer: Self-pay | Admitting: Primary Care

## 2024-02-03 ENCOUNTER — Telehealth (INDEPENDENT_AMBULATORY_CARE_PROVIDER_SITE_OTHER): Payer: Self-pay | Admitting: Primary Care

## 2024-02-03 DIAGNOSIS — B379 Candidiasis, unspecified: Secondary | ICD-10-CM

## 2024-02-03 LAB — CERVICOVAGINAL ANCILLARY ONLY
Bacterial Vaginitis (gardnerella): NEGATIVE
Candida Glabrata: NEGATIVE
Candida Vaginitis: POSITIVE — AB
Chlamydia: NEGATIVE
Comment: NEGATIVE
Comment: NEGATIVE
Comment: NEGATIVE
Comment: NEGATIVE
Comment: NEGATIVE
Comment: NORMAL
Neisseria Gonorrhea: NEGATIVE
Trichomonas: NEGATIVE

## 2024-02-03 MED ORDER — FLUCONAZOLE 150 MG PO TABS: 150.0000 mg | ORAL_TABLET | Freq: Every day | ORAL | 1 refills | Status: DC

## 2024-02-03 NOTE — Telephone Encounter (Signed)
 Copied from CRM [id number]. Topic: Clinical - Lab/Test Results >> Feb 03, 2024  3:19 PM Donald Frost wrote: Reason for CRM: The patient called in due to her lab results. She is requesting medicine to be called in because it was abnormal. After speaking with Jay'a I told the patient her provider hasn't been able to respond to the other lab yet but when she does she will get a response and something will be called in if the provider thinks it is necessary. Please assist patient further.

## 2024-02-04 NOTE — Telephone Encounter (Signed)
 Provider resulted labs thru MyChart and rx has been called in

## 2024-02-07 LAB — CYTOLOGY - PAP
Comment: NEGATIVE
Diagnosis: UNDETERMINED — AB
High risk HPV: NEGATIVE

## 2024-03-08 ENCOUNTER — Ambulatory Visit (HOSPITAL_COMMUNITY)
Admission: RE | Admit: 2024-03-08 | Discharge: 2024-03-08 | Disposition: A | Discharge status: Home / Self Care | Source: Ambulatory Visit | Attending: Family Medicine | Admitting: Family Medicine

## 2024-03-08 ENCOUNTER — Encounter (HOSPITAL_COMMUNITY): Payer: Self-pay

## 2024-03-08 VITALS — BP 128/88 | HR 90 | Temp 98.1°F | Resp 18

## 2024-03-08 DIAGNOSIS — Z113 Encounter for screening for infections with a predominantly sexual mode of transmission: Secondary | ICD-10-CM | POA: Insufficient documentation

## 2024-03-08 NOTE — Discharge Instructions (Signed)
 We have sent testing for sexually transmitted infections. We will notify you of any positive results once they are received. If required, we will prescribe any medications you might need.  Please refrain from all sexual activity for at least the next seven days.

## 2024-03-08 NOTE — ED Triage Notes (Signed)
Patient is here for STD testing. Denies any symptoms.

## 2024-03-09 LAB — CERVICOVAGINAL ANCILLARY ONLY
Bacterial Vaginitis (gardnerella): NEGATIVE
Candida Glabrata: NEGATIVE
Candida Vaginitis: NEGATIVE
Chlamydia: NEGATIVE
Comment: NEGATIVE
Comment: NEGATIVE
Comment: NEGATIVE
Comment: NEGATIVE
Comment: NEGATIVE
Comment: NORMAL
Neisseria Gonorrhea: NEGATIVE
Trichomonas: NEGATIVE

## 2024-03-09 NOTE — ED Provider Notes (Signed)
 [facility]   [id number] 03/08/24 Arrival Time: 1757  ASSESSMENT & PLAN:  1. Screening for STDs (sexually transmitted diseases)       Discharge Instructions      We have sent testing for sexually transmitted infections. We will notify you of any positive results once they are received. If required, we will prescribe any medications you might need.  Please refrain from all sexual activity for at least the next seven days.     Without s/s of PID.  Labs Reviewed  CERVICOVAGINAL ANCILLARY ONLY   Declines HIV/RPR.  Will notify of any positive results. Instructed to refrain from sexual activity for at least seven days.  Reviewed expectations re: course of current medical issues. Questions answered. Outlined signs and symptoms indicating need for more acute intervention. Patient verbalized understanding. After Visit Summary given.   SUBJECTIVE:  Amanda Daniel is a 23 y.o. female who requests STI screening. No symptoms.  Patient's last menstrual period was 03/05/2024 (approximate).   OBJECTIVE:  Vitals:   03/08/24 1827  BP: 128/88  Pulse: 90  Resp: 18  Temp: 98.1 F (36.7 C)  TempSrc: Oral  SpO2: 96%     General appearance: alert, cooperative, appears stated age and no distress GU: not perfomred Skin: warm and dry Psychological: alert and cooperative; normal mood and affect.    Labs Reviewed  CERVICOVAGINAL ANCILLARY ONLY    Allergies  Allergen Reactions   Lactose Intolerance (Gi)     unknown   Penicillins Other (See Comments)    Wheezing Has patient had a PCN reaction causing immediate rash, facial/tongue/throat swelling, SOB or lightheadedness with hypotension: No Has patient had a PCN reaction causing severe rash involving mucus membranes or skin necrosis: No Has patient had a PCN reaction that required hospitalization: No Has patient had a PCN reaction occurring within the last 10 years: No If all of the above answers are NO,  then may proceed with Cephalosporin use.    Egg-Derived Products Rash   Latex Rash    unknown   Peanut-Containing Drug Products Rash    Past Medical History:  Diagnosis Date   Allergic rhinitis    Asthma    hasnt used rescue inhaler in months   Atopic dermatitis 2014   Chlamydia infection affecting pregnancy 06/17/2017   Was treated 06/15/17, partner also treated [x ] TOC > check at 20 wk visit> neg   Obesity 2013   Preeclampsia, severe, third trimester 11/09/2017   Pregnancy affected by fetal growth restriction 11/03/2017       Guidelines for Antenatal Testing and Sonography  (with updated ICD-10 codes) INDICATION U/S NST/AFI DELIVERY IUGR- O36.5990   EFW < 10% w/ AEDF & low AFV or EFW < 3%     EFW < 10%, Nml Dopplers & AFV, AC<3%, no other comorbidities  PRN  [id number]  Inpatient  24//BPP+dopplers wkly  PRN  PRN or 39    Supervision of high risk pregnancy, antepartum 05/20/2017    Clinic [facility] Prenatal Labs Dating 1st trimester u/s Blood type: O/Positive/-- (08/30 0859) O pos Genetic Screen 1 Screen:    AFP:     Quad:     NIPS: Antibody:Negative (08/30 0859)neg Anatomic US   normal except velamentous cord Rubella: 3.79 (08/30 0859)Immune GTT Early:               Third trimester:  RPR: Non Reactive (08/30 0859) NR Flu vaccine  06/17/17 HBsAg: Negative (08/30 0859) Neg TDaP    Supervision of normal first  teen pregnancy 05/20/2017   Velamentous insertion of umbilical cord, 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   US  for growth @25  wks > SGA at 11%   Family History  Problem Relation Age of Onset   Healthy Mother    Healthy Father    Social History   Socioeconomic History   Marital status: Single    Spouse name: Not on file   Number of children: Not on file   Years of education: Not on file   Highest education level: 12th grade  Occupational History   Not on file  Tobacco Use   Smoking status: Never   Smokeless tobacco: Never  Substance and Sexual Activity   Alcohol use: No   Drug  use: No   Sexual activity: Yes    Birth control/protection: None  Other Topics Concern   Not on file  Social History Narrative   Moved to [place] from [place]  in 2012. Dad smokes outside   Social Drivers of Health   Financial Resource Strain: Low Risk  (12/17/2023)   Overall Financial Resource Strain (CARDIA)    Difficulty of Paying Living Expenses: Not hard at all  Food Insecurity: No Food Insecurity (12/17/2023)   Hunger Vital Sign    Worried About Running Out of Food in the Last Year: Never true    Ran Out of Food in the Last Year: Never true  Transportation Needs: No Transportation Needs (12/17/2023)   PRAPARE - Administrator, Civil Service (Medical): No    Lack of Transportation (Non-Medical): No  Physical Activity: Sufficiently Active (12/17/2023)   Exercise Vital Sign    Days of Exercise per Week: 7 days    Minutes of Exercise per Session: 150+ min  Stress: No Stress Concern Present (12/17/2023)   [employer] of Occupational Health - Occupational Stress Questionnaire    Feeling of Stress : Not at all  Social Connections: Moderately Isolated (12/17/2023)   Social Connection and Isolation Panel    Frequency of Communication with Friends and Family: More than three times a week    Frequency of Social Gatherings with Friends and Family: More than three times a week    Attends Religious Services: 1 to 4 times per year    Active Member of [employer] or Organizations: No    Attends Banker Meetings: Not on file    Marital Status: Never married  Intimate Partner Violence: Not At Risk (12/17/2023)   Humiliation, Afraid, Rape, and Kick questionnaire    Fear of Current or Ex-Partner: No    Emotionally Abused: No    Physically Abused: No    Sexually Abused: No           Afton Albright, MD 03/09/24 1110

## 2024-04-09 ENCOUNTER — Encounter (HOSPITAL_COMMUNITY): Payer: Self-pay | Admitting: *Deleted

## 2024-04-09 ENCOUNTER — Ambulatory Visit (HOSPITAL_COMMUNITY)
Admission: EM | Admit: 2024-04-09 | Discharge: 2024-04-09 | Disposition: A | Discharge status: Home / Self Care | Attending: Emergency Medicine | Admitting: Emergency Medicine

## 2024-04-09 ENCOUNTER — Other Ambulatory Visit: Payer: Self-pay

## 2024-04-09 DIAGNOSIS — S39012A Strain of muscle, fascia and tendon of lower back, initial encounter: Secondary | ICD-10-CM

## 2024-04-09 DIAGNOSIS — Z3202 Encounter for pregnancy test, result negative: Secondary | ICD-10-CM

## 2024-04-09 LAB — POCT URINALYSIS DIP (MANUAL ENTRY)
Bilirubin, UA: NEGATIVE
Blood, UA: NEGATIVE
Glucose, UA: NEGATIVE mg/dL
Ketones, POC UA: NEGATIVE mg/dL
Leukocytes, UA: NEGATIVE
Nitrite, UA: NEGATIVE
Protein Ur, POC: NEGATIVE mg/dL
Spec Grav, UA: 1.025
Urobilinogen, UA: 0.2 U/dL
pH, UA: 5.5

## 2024-04-09 LAB — POCT URINE PREGNANCY: Preg Test, Ur: NEGATIVE

## 2024-04-09 MED ORDER — METHOCARBAMOL 500 MG PO TABS: 500.0000 mg | ORAL_TABLET | Freq: Two times a day (BID) | ORAL | 0 refills | Status: AC

## 2024-04-09 MED ORDER — MELOXICAM 7.5 MG PO TABS: 7.5000 mg | ORAL_TABLET | Freq: Every day | ORAL | 0 refills | Status: AC

## 2024-04-09 NOTE — ED Notes (Signed)
PT drinking water. 

## 2024-04-09 NOTE — ED Notes (Signed)
 PT reports she can not void. Pt voided in waiting room before triage.

## 2024-04-09 NOTE — Discharge Instructions (Signed)
 Your back pain looks to be muscular.  Take the muscle relaxer up to 2 times daily, do not drink alcohol or drive on this medication as may cause drowsiness or sedation.  Take the meloxicam  daily to help with pain and inflammation, this is an NSAID so please do not take it with other NSAIDs such as ibuprofen  or Aleve .  Warm compress, gentle stretching and rest can help your your back as well.  Ensure you are using proper lifting technique.  Follow-up with sports medicine for any continued back pain.

## 2024-04-09 NOTE — ED Triage Notes (Signed)
 PT reports Lt sided back and side pain for 2 days.

## 2024-04-09 NOTE — ED Provider Notes (Signed)
 [facility]    CSN: [id number] Arrival date & time: 04/09/24  1112      History   Chief Complaint Chief Complaint  Patient presents with   Back Pain    HPI Amanda Daniel is a 23 y.o. female.   Patient presents to clinic over concern of left-sided low back pain that she noticed while coming home from work 2 nights ago.  Noticed that the pain is worse with bending and twisting.  Has not tried any interventions for this pain.  Denies any urinary symptoms, without dysuria, urgency, frequency or hematuria.  Denies numbness, tingling or weakness.  Ambulatory.  The history is provided by the patient and medical records.  Back Pain   Past Medical History:  Diagnosis Date   Allergic rhinitis    Asthma    hasnt used rescue inhaler in months   Atopic dermatitis 2014   Chlamydia infection affecting pregnancy 06/17/2017   Was treated 06/15/17, partner also treated [x ] TOC > check at 20 wk visit> neg   Obesity 2013   Preeclampsia, severe, third trimester 11/09/2017   Pregnancy affected by fetal growth restriction 11/03/2017       Guidelines for Antenatal Testing and Sonography  (with updated ICD-10 codes) INDICATION U/S NST/AFI DELIVERY IUGR- O36.5990   EFW < 10% w/ AEDF & low AFV or EFW < 3%     EFW < 10%, Nml Dopplers & AFV, AC<3%, no other comorbidities  PRN  [id number]  Inpatient  24//BPP+dopplers wkly  PRN  PRN or 39    Supervision of high risk pregnancy, antepartum 05/20/2017    Clinic [facility] Prenatal Labs Dating 1st trimester u/s Blood type: O/Positive/-- (08/30 0859) O pos Genetic Screen 1 Screen:    AFP:     Quad:     NIPS: Antibody:Negative (08/30 0859)neg Anatomic US   normal except velamentous cord Rubella: 3.79 (08/30 0859)Immune GTT Early:               Third trimester:  RPR: Non Reactive (08/30 0859) NR Flu vaccine  06/17/17 HBsAg: Negative (08/30 0859) Neg TDaP    Supervision of normal first teen pregnancy 05/20/2017   Velamentous insertion of  umbilical cord, 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   US  for growth @25  wks > SGA at 11%    Patient Active Problem List   Diagnosis Date Noted   Obesity in pregnancy, antepartum 09/29/2017   Allergic rhinitis 03/27/2014   Atopic dermatitis 03/27/2014   BMI (body mass index), pediatric, > 99% for age 18/09/2013   Asthma, moderate persistent, well-controlled 12/20/2013   Failed hearing screening 12/20/2013   Acne 12/20/2013    Past Surgical History:  Procedure Laterality Date   TONSILLECTOMY      OB History     Gravida  1   Para  1   Term  1   Preterm      AB      Living  1      SAB      IAB      Ectopic      Multiple  0   Live Births  1            Home Medications    Prior to Admission medications   Medication Sig Start Date End Date Taking? Authorizing Provider  FLOVENT  HFA 110 MCG/ACT inhaler Inhale 2 puffs into the lungs 2 (two) times daily. 08/01/19  Yes Leta Crazier, MD  fluticasone  (FLONASE ) 50 MCG/ACT nasal spray Place 1 spray into both  nostrils daily. 1 spray in each nostril every day 08/01/19  Yes Leta Crazier, MD  meloxicam  (MOBIC ) 7.5 MG tablet Take 1 tablet (7.5 mg total) by mouth daily for 15 days. 04/09/24 04/24/24 Yes Disney Ruggiero  N, FNP  methocarbamol  (ROBAXIN ) 500 MG tablet Take 1 tablet (500 mg total) by mouth 2 (two) times daily. 04/09/24  Yes Kaleb Linquist  N, FNP  PROAIR  HFA 108 (90 Base) MCG/ACT inhaler TAKE 2 PUFFS BY MOUTH EVERY 6 HOURS AS NEEDED FOR WHEEZE OR SHORTNESS OF BREATH 05/16/19  Yes Leta Crazier, MD  ibuprofen  (ADVIL ) 600 MG tablet Take 1 tablet (600 mg total) by mouth every 8 (eight) hours as needed. 01/04/24   Celestia Rosaline SQUIBB, NP  triamcinolone  ointment (KENALOG ) 0.1 % Apply 1 application. topically 2 (two) times daily. 12/11/21   Vonna Sharlet POUR, MD    Family History Family History  Problem Relation Age of Onset   Healthy Mother    Healthy Father     Social History Social History   Tobacco Use    Smoking status: Never   Smokeless tobacco: Never  Substance Use Topics   Alcohol use: No   Drug use: No     Allergies   Lactose intolerance (gi), Penicillins, Egg-derived products, Latex, and Peanut-containing drug products   Review of Systems Review of Systems  Per HPI  Physical Exam Triage Vital Signs ED Triage Vitals  Encounter Vitals Group     BP 04/09/24 1124 118/78     Girls Systolic BP Percentile --      Girls Diastolic BP Percentile --      Boys Systolic BP Percentile --      Boys Diastolic BP Percentile --      Pulse Rate 04/09/24 1124 75     Resp 04/09/24 1124 20     Temp 04/09/24 1124 97.9 F (36.6 C)     Temp src --      SpO2 04/09/24 1124 98 %     Weight --      Height --      Head Circumference --      Peak Flow --      Pain Score 04/09/24 1122 8     Pain Loc --      Pain Education --      Exclude from Growth Chart --    No data found.  Updated Vital Signs BP 118/78   Pulse 75   Temp 97.9 F (36.6 C)   Resp 20   LMP 02/28/2024   SpO2 98%   Visual Acuity Right Eye Distance:   Left Eye Distance:   Bilateral Distance:    Right Eye Near:   Left Eye Near:    Bilateral Near:     Physical Exam Vitals and nursing note reviewed.  Constitutional:      Appearance: Normal appearance.  HENT:     Head: Normocephalic and atraumatic.     Right Ear: External ear normal.     Left Ear: External ear normal.     Nose: Nose normal.     Mouth/Throat:     Mouth: Mucous membranes are moist.  Eyes:     Conjunctiva/sclera: Conjunctivae normal.  Cardiovascular:     Rate and Rhythm: Normal rate.  Pulmonary:     Effort: Pulmonary effort is normal. No respiratory distress.  Abdominal:     Tenderness: There is no right CVA tenderness or left CVA tenderness.  Musculoskeletal:        General: Tenderness  present. No swelling or deformity.     Lumbar back: Tenderness present. No bony tenderness.       Back:     Comments: Left sided LBP TTP, no overlying  skin changes or rashes.  Pain reproducible with range of motion.  Skin:    General: Skin is warm and dry.     Findings: No rash.  Neurological:     General: No focal deficit present.     Mental Status: She is alert and oriented to person, place, and time.  Psychiatric:        Mood and Affect: Mood normal.        Behavior: Behavior normal. Behavior is cooperative.      UC Treatments / Results  Labs (all labs ordered are listed, but only abnormal results are displayed) Labs Reviewed  POCT URINALYSIS DIP (MANUAL ENTRY)  POCT URINE PREGNANCY    EKG   Radiology No results found.  Procedures Procedures (including critical care time)  Medications Ordered in UC Medications - No data to display  Initial Impression / Assessment and Plan / UC Course  I have reviewed the triage vital signs and the nursing notes.  Pertinent labs & imaging results that were available during my care of the patient were reviewed by me and considered in my medical decision making (see chart for details).  Vitals and triage reviewed, patient is hemodynamically stable.  Negative CVA tenderness, without urinary symptoms, UA unremarkable.  Irregular menstrual cycle, negative urine pregnancy.  Left-sided lumbar tenderness to palpation.  Spine without step-off or deformity.  Imaging deferred.  Muscular pain reproducible with range of motion.  Symptomatic management for low back strain discussed.  Plan of care, follow-up care return precautions given, no questions at this time.  Work note provided.     Final Clinical Impressions(s) / UC Diagnoses   Final diagnoses:  Strain of lumbar region, initial encounter     Discharge Instructions      Your back pain looks to be muscular.  Take the muscle relaxer up to 2 times daily, do not drink alcohol or drive on this medication as may cause drowsiness or sedation.  Take the meloxicam  daily to help with pain and inflammation, this is an NSAID so please do not  take it with other NSAIDs such as ibuprofen  or Aleve .  Warm compress, gentle stretching and rest can help your your back as well.  Ensure you are using proper lifting technique.  Follow-up with sports medicine for any continued back pain.     ED Prescriptions     Medication Sig Dispense Auth. Provider   methocarbamol  (ROBAXIN ) 500 MG tablet Take 1 tablet (500 mg total) by mouth 2 (two) times daily. 20 tablet Dreama, Jaskaran Dauzat  N, FNP   meloxicam  (MOBIC ) 7.5 MG tablet Take 1 tablet (7.5 mg total) by mouth daily for 15 days. 15 tablet Dreama, Nayely Dingus  N, FNP      PDMP not reviewed this encounter.   Dreama Erdine Hulen  N, FNP 04/09/24 1231

## 2024-05-03 ENCOUNTER — Telehealth (INDEPENDENT_AMBULATORY_CARE_PROVIDER_SITE_OTHER): Payer: Self-pay | Admitting: Primary Care

## 2024-05-03 NOTE — Telephone Encounter (Signed)
 Called pt to confirm appt. Pt will not be able to attend. Pt wants to reschedule.

## 2024-05-04 ENCOUNTER — Ambulatory Visit (INDEPENDENT_AMBULATORY_CARE_PROVIDER_SITE_OTHER): Admitting: Primary Care

## 2024-06-20 ENCOUNTER — Ambulatory Visit (INDEPENDENT_AMBULATORY_CARE_PROVIDER_SITE_OTHER): Admitting: Primary Care

## 2024-07-21 ENCOUNTER — Ambulatory Visit: Admission: EM | Admit: 2024-07-21 | Discharge: 2024-07-21 | Disposition: A | Discharge status: Home / Self Care

## 2024-07-21 ENCOUNTER — Encounter: Payer: Self-pay | Admitting: Emergency Medicine

## 2024-07-21 DIAGNOSIS — M7918 Myalgia, other site: Secondary | ICD-10-CM

## 2024-07-21 MED ORDER — ACETAMINOPHEN 325 MG PO TABS
650.0000 mg | ORAL_TABLET | Freq: Once | ORAL | Status: AC
  Administered 2024-07-21: 650 mg via ORAL

## 2024-07-21 MED ORDER — IBUPROFEN 400 MG PO TABS
400.0000 mg | ORAL_TABLET | Freq: Once | ORAL | Status: AC
  Administered 2024-07-21: 400 mg via ORAL

## 2024-07-21 NOTE — ED Provider Notes (Signed)
 [facility]    CSN: [id number] Arrival date & time: 07/21/24  1535      History   Chief Complaint Chief Complaint  Patient presents with   Generalized Body Aches    HPI Amanda Daniel is a 23 y.o. female.   Patient presents today due to generalized bodyaches after walking on the beach.  Patient denies fever, chills, nausea, vomiting, diarrhea, cough, nasal congestion, or known sick contacts.  Patient denies use of anything for symptoms.  The history is provided by the patient.    Past Medical History:  Diagnosis Date   Allergic rhinitis    Asthma    hasnt used rescue inhaler in months   Atopic dermatitis 2014   Chlamydia infection affecting pregnancy 06/17/2017   Was treated 06/15/17, partner also treated [x ] TOC > check at 20 wk visit> neg   Obesity 2013   Preeclampsia, severe, third trimester 11/09/2017   Pregnancy affected by fetal growth restriction 11/03/2017       Guidelines for Antenatal Testing and Sonography  (with updated ICD-10 codes) INDICATION U/S NST/AFI DELIVERY IUGR- O36.5990   EFW < 10% w/ AEDF & low AFV or EFW < 3%     EFW < 10%, Nml Dopplers & AFV, AC<3%, no other comorbidities  PRN  [id number]  Inpatient  24//BPP+dopplers wkly  PRN  PRN or 39    Supervision of high risk pregnancy, antepartum 05/20/2017    Clinic [facility] Prenatal Labs Dating 1st trimester u/s Blood type: O/Positive/-- (08/30 0859) O pos Genetic Screen 1 Screen:    AFP:     Quad:     NIPS: Antibody:Negative (08/30 0859)neg Anatomic US   normal except velamentous cord Rubella: 3.79 (08/30 0859)Immune GTT Early:               Third trimester:  RPR: Non Reactive (08/30 0859) NR Flu vaccine  06/17/17 HBsAg: Negative (08/30 0859) Neg TDaP    Supervision of normal first teen pregnancy 05/20/2017   Velamentous insertion of umbilical cord, 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   US  for growth @25  wks > SGA at 11%    Patient Active Problem List   Diagnosis Date Noted   Arthralgia of right  elbow 12/25/2020   Closed fracture of coronoid process of right ulna 12/25/2020   Obesity in pregnancy, antepartum 09/29/2017   Allergic rhinitis 03/27/2014   Atopic dermatitis 03/27/2014   BMI (body mass index), pediatric, > 99% for age 85/09/2013   Asthma, moderate persistent, well-controlled 12/20/2013   Abnormal hearing test 12/20/2013   Acne 12/20/2013   Childhood obesity 12/20/2013    Past Surgical History:  Procedure Laterality Date   TONSILLECTOMY      OB History     Gravida  1   Para  1   Term  1   Preterm      AB      Living  1      SAB      IAB      Ectopic      Multiple  0   Live Births  1            Home Medications    Prior to Admission medications   Medication Sig Start Date End Date Taking? Authorizing Provider  FLOVENT  HFA 110 MCG/ACT inhaler Inhale 2 puffs into the lungs 2 (two) times daily. 08/01/19  Yes Leta Crazier, MD  fluticasone  (FLONASE ) 50 MCG/ACT nasal spray Place 1 spray into both nostrils daily. 1 spray in each nostril  every day 08/01/19  Yes Leta Crazier, MD  PROAIR  HFA 108 (90 Base) MCG/ACT inhaler TAKE 2 PUFFS BY MOUTH EVERY 6 HOURS AS NEEDED FOR WHEEZE OR SHORTNESS OF BREATH 05/16/19  Yes Leta Crazier, MD  cetirizine  (ZYRTEC ) 10 MG tablet 10 mg by oral route. 08/01/19   [provider]  diclofenac (VOLTAREN) 75 MG EC tablet     [provider]  ibuprofen  (ADVIL ) 600 MG tablet Take 1 tablet (600 mg total) by mouth every 8 (eight) hours as needed. 01/04/24   Celestia Rosaline SQUIBB, NP  methocarbamol  (ROBAXIN ) 500 MG tablet Take 1 tablet (500 mg total) by mouth 2 (two) times daily. Patient not taking: Reported on 07/21/2024 04/09/24   Dreama, [place]  N, FNP  oxyCODONE -acetaminophen  (PERCOCET/ROXICET) 5-325 MG tablet TAKE 1-2 TABLETS BY MOUTH EVERY 6 HOURS AS NEEDED FOR SEVERE PAIN. Patient not taking: Reported on 07/21/2024    [provider]  triamcinolone  ointment (KENALOG ) 0.1 % Apply  1 application. topically 2 (two) times daily. Patient not taking: Reported on 07/21/2024 12/11/21   Vonna Sharlet POUR, MD    Family History Family History  Problem Relation Age of Onset   Healthy Mother    Healthy Father     Social History Social History   Tobacco Use   Smoking status: Never   Smokeless tobacco: Never  Vaping Use   Vaping status: Never Used  Substance Use Topics   Alcohol use: No   Drug use: No     Allergies   Lactose intolerance (gi), Penicillins, Egg protein-containing drug products, Latex, and Peanut-containing drug products   Review of Systems Review of Systems   Physical Exam Triage Vital Signs ED Triage Vitals  Encounter Vitals Group     BP 07/21/24 1602 130/85     Girls Systolic BP Percentile --      Girls Diastolic BP Percentile --      Boys Systolic BP Percentile --      Boys Diastolic BP Percentile --      Pulse Rate 07/21/24 1602 74     Resp 07/21/24 1602 14     Temp 07/21/24 1602 97.8 F (36.6 C)     Temp Source 07/21/24 1602 Oral     SpO2 07/21/24 1602 98 %     Weight --      Height --      Head Circumference --      Peak Flow --      Pain Score 07/21/24 1603 8     Pain Loc --      Pain Education --      Exclude from Growth Chart --    No data found.  Updated Vital Signs BP 130/85 (BP Location: Left Arm)   Pulse 74   Temp 97.8 F (36.6 C) (Oral)   Resp 14   LMP 07/15/2024 (Exact Date)   SpO2 98%   Visual Acuity Right Eye Distance:   Left Eye Distance:   Bilateral Distance:    Right Eye Near:   Left Eye Near:    Bilateral Near:     Physical Exam Vitals and nursing note reviewed.  Constitutional:      General: She is not in acute distress.    Appearance: Normal appearance. She is not ill-appearing, toxic-appearing or diaphoretic.  Eyes:     General: No scleral icterus. Cardiovascular:     Rate and Rhythm: Normal rate and regular rhythm.     Heart sounds: Normal heart sounds.  Pulmonary:  Effort:  Pulmonary effort is normal. No respiratory distress.     Breath sounds: Normal breath sounds. No wheezing or rhonchi.  Musculoskeletal:     Comments: Muscle pain elicited in multiple areas with active range of motion.  Skin:    General: Skin is warm.  Neurological:     Mental Status: She is alert and oriented to person, place, and time.  Psychiatric:        Mood and Affect: Mood normal.        Behavior: Behavior normal.      UC Treatments / Results  Labs (all labs ordered are listed, but only abnormal results are displayed) Labs Reviewed - No data to display  EKG   Radiology No results found.  Procedures Procedures (including critical care time)  Medications Ordered in UC Medications  acetaminophen  (TYLENOL ) tablet 650 mg (650 mg Oral Given 07/21/24 1608)  ibuprofen  (ADVIL ) tablet 400 mg (400 mg Oral Given 07/21/24 1640)    Initial Impression / Assessment and Plan / UC Course  I have reviewed the triage vital signs and the nursing notes.  Pertinent labs & imaging results that were available during my care of the patient were reviewed by me and considered in my medical decision making (see chart for details).     Patient is experiencing relief with in office medication. Final Clinical Impressions(s) / UC Diagnoses   Final diagnoses:  None   Discharge Instructions   None    ED Prescriptions   None    PDMP not reviewed this encounter.   Andra Corean BROCKS, PA-C 07/21/24 [phone]

## 2024-07-21 NOTE — ED Triage Notes (Addendum)
 Pt reports generalized constant body aches x6 days. She had just returned from the [employer] financial vacation prior to aches starting. Denies fevers, chills, nasal congestion, cough, sore throat, SOB, chest pain, N/V, diarrhea, or chest pain. No one from vacation has similar symptoms. Pt denies any strenuous activity prior to onset of aches. No med use for symptoms.   Pt does report tingling in her R hand that started at the same time. Denies numbness. Tingling from palm to fingertips. Does not radiate up or down the hand. Pt reports MD has previously looked at this hand and mentioned tendonitis to her. +

## 2024-07-21 NOTE — Discharge Instructions (Addendum)
 Today you have been diagnosed with a musculoskeletal injury.  Adults may use 400 mg of ibuprofen  and 1000 mg of Tylenol  together every 8 hours as needed for pain control.  Children may take ibuprofen  and Tylenol  as directed on medication packaging.  You should use ice on affected area for 20 minutes at a time a couple times a day for the first 24 hours then you may switch to heat in the same intervals.  Be sure to put a barrier between ice or heat source and skin to prevent burns.  May also wrap affected area and Ace bandage if tolerated and appropriate, and elevate above the level of the heart to help reduce swelling.  Do not wrap Ace bandages around neck or torso as wrapping too tight can restrict air movement inability to breathe.  If symptoms do not seem to be improving in 3 to 5 days after following these instructions we need to follow-up with orthopedist or PCP.

## 2024-08-03 DIAGNOSIS — H5213 Myopia, bilateral: Secondary | ICD-10-CM | POA: Diagnosis not present
